# Patient Record
Sex: Male | Born: 1945 | Race: White | Hispanic: No | Marital: Married | Smoking: Former smoker
Health system: Southern US, Community
[De-identification: ages and names within clinical notes are randomized; demographics above are authoritative.]

## PROBLEM LIST (undated history)

## (undated) DIAGNOSIS — K219 Gastro-esophageal reflux disease without esophagitis: Secondary | ICD-10-CM

## (undated) DIAGNOSIS — I4891 Unspecified atrial fibrillation: Secondary | ICD-10-CM

## (undated) DIAGNOSIS — E119 Type 2 diabetes mellitus without complications: Secondary | ICD-10-CM

## (undated) DIAGNOSIS — N189 Chronic kidney disease, unspecified: Secondary | ICD-10-CM

## (undated) DIAGNOSIS — I10 Essential (primary) hypertension: Secondary | ICD-10-CM

## (undated) DIAGNOSIS — M1990 Unspecified osteoarthritis, unspecified site: Secondary | ICD-10-CM

## (undated) HISTORY — PX: EYE SURGERY: SHX253

## (undated) HISTORY — PX: KNEE ARTHROSCOPY: SUR90

## (undated) HISTORY — PX: JOINT REPLACEMENT: SHX530

## (undated) HISTORY — PX: GANGLION CYST EXCISION: SHX1691

---

## 2013-03-22 DEATH — deceased

## 2014-04-14 ENCOUNTER — Other Ambulatory Visit: Payer: Self-pay | Admitting: Neurology

## 2014-04-14 DIAGNOSIS — M542 Cervicalgia: Secondary | ICD-10-CM

## 2014-05-08 ENCOUNTER — Other Ambulatory Visit: Payer: Self-pay

## 2014-05-15 ENCOUNTER — Ambulatory Visit
Admission: RE | Admit: 2014-05-15 | Discharge: 2014-05-15 | Disposition: A | Discharge status: Home / Self Care | Payer: Medicare HMO | Source: Ambulatory Visit | Attending: Neurology | Admitting: Neurology

## 2014-05-15 ENCOUNTER — Other Ambulatory Visit: Payer: Self-pay | Admitting: Neurology

## 2014-05-15 ENCOUNTER — Ambulatory Visit
Admission: RE | Admit: 2014-05-15 | Discharge: 2014-05-15 | Disposition: A | Discharge status: Home / Self Care | Payer: Non-veteran care | Source: Ambulatory Visit | Attending: Neurology | Admitting: Neurology

## 2014-05-15 DIAGNOSIS — T1590XA Foreign body on external eye, part unspecified, unspecified eye, initial encounter: Secondary | ICD-10-CM

## 2014-05-15 DIAGNOSIS — M542 Cervicalgia: Secondary | ICD-10-CM

## 2015-10-03 NOTE — Pre-Procedure Instructions (Addendum)
Devon SangerFred Williams  10/03/2015     Your procedure is scheduled on September 22.  [facility] at 11:30 A.M.  Call this number if you have problems the morning of surgery:  [phone]   Remember:  Do not eat food or drink liquids after midnight.  Take these medicines the morning of surgery with A SIP OF WATER: tylenol, Besivance, Durezol, Loratadine, Omeprazole, Venlafaxine   STOP Lovaza(fish oil), Vitamin D, Aspirin September 15   STOP/ Do not take Aspirin, Aleve, Naproxen, Advil, Ibuprofen, Motrin, Vitamins, Herbs, or Supplements starting September 15   How to Manage Your Diabetes Before and After Surgery  Why is it important to control my blood sugar before and after surgery? . Improving blood sugar levels before and after surgery helps healing and can limit problems. . A way of improving blood sugar control is eating a healthy diet by: o  Eating less sugar and carbohydrates o  Increasing activity/exercise o  Talking with your doctor about reaching your blood sugar goals . High blood sugars (greater than 180 mg/dL) can raise your risk of infections and slow your recovery, so you will need to focus on controlling your diabetes during the weeks before surgery. . Make sure that the doctor who takes care of your diabetes knows about your planned surgery including the date and location.  How do I manage my blood sugar before surgery? . Check your blood sugar at least 4 times a day, starting 2 days before surgery, to make sure that the level is not too high or low. o Check your blood sugar the morning of your surgery when you wake up and every 2 hours until you get to the Short Stay unit. . If your blood sugar is less than 70 mg/dL, you will need to treat for low blood sugar: o Do not take insulin. o Treat a low blood sugar (less than 70 mg/dL) with  cup of clear juice (cranberry or apple), 4 glucose tablets, OR glucose gel. o Recheck blood sugar in 15  minutes after treatment (to make sure it is greater than 70 mg/dL). If your blood sugar is not greater than 70 mg/dL on recheck, call [phone] for further instructions. . Report your blood sugar to the short stay nurse when you get to Short Stay.  . If you are admitted to the hospital after surgery: o Your blood sugar will be checked by the staff and you will probably be given insulin after surgery (instead of oral diabetes medicines) to make sure you have good blood sugar levels. o The goal for blood sugar control after surgery is 80-180 mg/dL.    WHAT DO I DO ABOUT MY DIABETES MEDICATION?   Marland Kitchen. Do not take oral diabetes medicines (pills) the morning of surgery.   Do not wear jewelry, make-up or nail polish.  Do not wear lotions, powders, or perfumes, or deoderant.  Do not shave 48 hours prior to surgery.  Men may shave face and neck.  Do not bring valuables to the hospital.  [facility] is not responsible for any belongings or valuables.  Contacts, dentures or bridgework may not be worn into surgery.  Leave your suitcase in the car.  After surgery it may be brought to your room.  For patients admitted to the hospital, discharge time will be determined by your treatment team.  Patients discharged the day of surgery will not be allowed to drive home.   [place] - Preparing  for Surgery  Before surgery, you can play an important role.  Because skin is not sterile, your skin needs to be as free of germs as possible.  You can reduce the number of germs on you skin by washing with CHG (chlorahexidine gluconate) soap before surgery.  CHG is an antiseptic cleaner which kills germs and bonds with the skin to continue killing germs even after washing.  Please DO NOT use if you have an allergy to CHG or antibacterial soaps.  If your skin becomes reddened/irritated stop using the CHG and inform your nurse when you arrive at Short Stay.  Do not shave (including legs and underarms) for at  least 48 hours prior to the first CHG shower.  You may shave your face.  Please follow these instructions carefully:   1.  Shower with CHG Soap the night before surgery and the morning of Surgery.  2.  If you choose to wash your hair, wash your hair first as usual with your normal shampoo.  3.  After you shampoo, rinse your hair and body thoroughly to remove the shampoo.  4.  Use CHG as you would any other liquid soap.  You can apply CHG directly to the skin and wash gently with scrungie or a clean washcloth.  5.  Apply the CHG Soap to your body ONLY FROM THE NECK DOWN.  Do not use on open wounds or open sores.  Avoid contact with your eyes, ears, mouth and genitals (private parts).  Wash genitals (private parts) with your normal soap.  6.  Wash thoroughly, paying special attention to the area where your surgery will be performed.  7.  Thoroughly rinse your body with warm water from the neck down.  8.  DO NOT shower/wash with your normal soap after using and rinsing off the CHG Soap.  9.  Pat yourself dry with a clean towel.            10.  Wear clean pajamas.            11.  Place clean sheets on your bed the night of your first shower and do not sleep with pets.  Day of Surgery  Do not apply any lotions the morning of surgery.  Please wear clean clothes to the hospital/surgery center.

## 2015-10-04 ENCOUNTER — Encounter (HOSPITAL_COMMUNITY): Payer: Self-pay

## 2015-10-04 ENCOUNTER — Encounter (HOSPITAL_COMMUNITY)
Admission: RE | Admit: 2015-10-04 | Discharge: 2015-10-04 | Disposition: A | Discharge status: Home / Self Care | Payer: Non-veteran care | Source: Ambulatory Visit | Attending: Orthopedic Surgery | Admitting: Orthopedic Surgery

## 2015-10-04 DIAGNOSIS — Z01818 Encounter for other preprocedural examination: Secondary | ICD-10-CM | POA: Diagnosis present

## 2015-10-04 HISTORY — DX: Essential (primary) hypertension: I10

## 2015-10-04 HISTORY — DX: Chronic kidney disease, unspecified: N18.9

## 2015-10-04 HISTORY — DX: Unspecified osteoarthritis, unspecified site: M19.90

## 2015-10-04 HISTORY — DX: Gastro-esophageal reflux disease without esophagitis: K21.9

## 2015-10-04 HISTORY — DX: Type 2 diabetes mellitus without complications: E11.9

## 2015-10-04 LAB — BASIC METABOLIC PANEL
Anion gap: 8 (ref 5–15)
BUN: 16 mg/dL (ref 6–20)
CALCIUM: 9.7 mg/dL (ref 8.9–10.3)
CO2: 23 mmol/L (ref 22–32)
CREATININE: 1.25 mg/dL — AB (ref 0.61–1.24)
Chloride: 107 mmol/L (ref 101–111)
GFR calc non Af Amer: 57 mL/min — ABNORMAL LOW (ref >60)
GLUCOSE: 130 mg/dL — AB (ref 65–99)
Potassium: 4.3 mmol/L (ref 3.5–5.1)
Sodium: 138 mmol/L (ref 135–145)

## 2015-10-04 LAB — CBC
HEMATOCRIT: 43.2 % (ref 39.0–52.0)
Hemoglobin: 14.4 g/dL (ref 13.0–17.0)
MCH: 30.6 pg (ref 26.0–34.0)
MCHC: 33.3 g/dL (ref 30.0–36.0)
MCV: 91.9 fL (ref 78.0–100.0)
PLATELETS: 235 10*3/uL (ref 150–400)
RBC: 4.7 MIL/uL (ref 4.22–5.81)
RDW: 13 % (ref 11.5–15.5)
WBC: 8.2 10*3/uL (ref 4.0–10.5)

## 2015-10-04 LAB — SURGICAL PCR SCREEN
MRSA, PCR: NEGATIVE
Staphylococcus aureus: NEGATIVE

## 2015-10-04 LAB — GLUCOSE, CAPILLARY: GLUCOSE-CAPILLARY: 132 mg/dL — AB (ref 65–99)

## 2015-10-04 NOTE — H&P (Signed)
Devon Williams is an 70 y.o. male.    Chief Complaint: left knee pain  HPI: Pt is a 70 y.o. male complaining of left knee pain for multiple years. Pain had continually increased since the beginning. X-rays in the clinic show end-stage arthritic changes of the left knee. Pt has tried various conservative treatments which have failed to alleviate their symptoms, including injections and therapy. Various options are discussed with the patient. Risks, benefits and expectations were discussed with the patient. Patient understand the risks, benefits and expectations and wishes to proceed with surgery.   PCP:  WEAVER,JOHN W, MD  D/C Plans: Home  PMH: Past Medical History:  Diagnosis Date  . Arthritis   . Chronic kidney disease   . Diabetes mellitus without complication (HCC)   . GERD (gastroesophageal reflux disease)   . Hypertension     PSH: Past Surgical History:  Procedure Laterality Date  . EYE SURGERY Left    cataract-to have rt done 09/04/15  . GANGLION CYST EXCISION Right    hand  . KNEE ARTHROSCOPY Left    x3    Social History:  reports that he quit smoking about 39 years ago. His smoking use included Cigarettes. He smoked 1.00 pack per day. He quit smokeless tobacco use about 6 years ago. He reports that he does not drink alcohol or use drugs.  Allergies:  No Known Allergies  Medications: No current facility-administered medications for this encounter.    Current Outpatient Prescriptions  Medication Sig Dispense Refill  . acetaminophen (TYLENOL) 500 MG tablet Take 1,000 mg by mouth 2 (two) times daily.    . aspirin EC 81 MG tablet Take 81 mg by mouth at bedtime.    . atorvastatin (LIPITOR) 80 MG tablet Take 80 mg by mouth daily.    . BESIVANCE 0.6 % SUSP Place 1 drop into the left eye 3 (three) times daily.  1  . Cholecalciferol (VITAMIN D3) 2000 units capsule Take 2,000 Units by mouth daily.    . DUREZOL 0.05 % EMUL Place 1 drop into the left eye 2 (two) times daily. For  1 week then go to 1 drop into left eye once daily for 1 week  1  . glipiZIDE (GLUCOTROL) 10 MG tablet Take 10 mg by mouth 2 (two) times daily.    . linagliptin (TRADJENTA) 5 MG TABS tablet Take 5 mg by mouth daily.    . lisinopril (PRINIVIL,ZESTRIL) 20 MG tablet Take 10 mg by mouth at bedtime.    . loratadine (CLARITIN) 10 MG tablet Take 10 mg by mouth daily.    . lurasidone (LATUDA) 40 MG TABS tablet Take 40 mg by mouth at bedtime.    . omega-3 acid ethyl esters (LOVAZA) 1 g capsule Take 2 g by mouth 2 (two) times daily.    . omeprazole (PRILOSEC) 20 MG capsule Take 20 mg by mouth at bedtime.    . PROLENSA 0.07 % SOLN Place 1 drop into the left eye at bedtime.  1  . tiZANidine (ZANAFLEX) 4 MG tablet Take 2 mg by mouth at bedtime.    . venlafaxine XR (EFFEXOR-XR) 150 MG 24 hr capsule Take 150 mg by mouth 2 (two) times daily. Morning and noon    . oxyCODONE-acetaminophen (PERCOCET/ROXICET) 5-325 MG tablet Take 1 tablet by mouth every 4 (four) hours as needed for severe pain.      Results for orders placed or performed during the hospital encounter of 10/04/15 (from the past 48 hour(s))  Glucose,   capillary     Status: Abnormal   Collection Time: 10/04/15  8:40 AM  Result Value Ref Range   Glucose-Capillary 132 (H) 65 - 99 mg/dL  CBC     Status: None   Collection Time: 10/04/15  9:03 AM  Result Value Ref Range   WBC 8.2 4.0 - 10.5 K/uL   RBC 4.70 4.22 - 5.81 MIL/uL   Hemoglobin 14.4 13.0 - 17.0 g/dL   HCT 43.2 39.0 - 52.0 %   MCV 91.9 78.0 - 100.0 fL   MCH 30.6 26.0 - 34.0 pg   MCHC 33.3 30.0 - 36.0 g/dL   RDW 13.0 11.5 - 15.5 %   Platelets 235 150 - 400 K/uL  Basic metabolic panel     Status: Abnormal   Collection Time: 10/04/15  9:03 AM  Result Value Ref Range   Sodium 138 135 - 145 mmol/L   Potassium 4.3 3.5 - 5.1 mmol/L   Chloride 107 101 - 111 mmol/L   CO2 23 22 - 32 mmol/L   Glucose, Bld 130 (H) 65 - 99 mg/dL   BUN 16 6 - 20 mg/dL   Creatinine, Ser 1.25 (H) 0.61 - 1.24  mg/dL   Calcium 9.7 8.9 - 10.3 mg/dL   GFR calc non Af Amer 57 (L) >60 mL/min   GFR calc Af Amer >60 >60 mL/min    Comment: (NOTE) The eGFR has been calculated using the CKD EPI equation. This calculation has not been validated in all clinical situations. eGFR's persistently <60 mL/min signify possible Chronic Kidney Disease.    Anion gap 8 5 - 15  Surgical pcr screen     Status: None   Collection Time: 10/04/15  9:08 AM  Result Value Ref Range   MRSA, PCR NEGATIVE NEGATIVE   Staphylococcus aureus NEGATIVE NEGATIVE    Comment:        The Xpert SA Assay (FDA approved for NASAL specimens in patients over 21 years of age), is one component of a comprehensive surveillance program.  Test performance has been validated by [facility] for patients greater than or equal to 1 year old. It is not intended to diagnose infection nor to guide or monitor treatment.    No results found.  ROS: Pain with rom of the left lower extremity  Physical Exam:  Alert and oriented 70 y.o. male in no acute distress Cranial nerves 2-12 intact Cervical spine: full rom with no tenderness, nv intact distally Chest: active breath sounds bilaterally, no wheeze rhonchi or rales Heart: regular rate and rhythm, no murmur Abd: non tender non distended with active bowel sounds Hip is stable with rom  Left knee with moderate medial and lateral joint line tenderness nv intact distally No rashes or edema Antalgic gait  Assessment/Plan Assessment: left knee end stage osteoarthritis  Plan: Patient will undergo a left total knee arthroplasty by Dr. Norris at [facility]. Risks benefits and expectations were discussed with the patient. Patient understand risks, benefits and expectations and wishes to proceed.  

## 2015-10-04 NOTE — Progress Notes (Signed)
   10/04/15 0848  OBSTRUCTIVE SLEEP APNEA  Have you ever been diagnosed with sleep apnea through a sleep study? No  Do you snore loudly (loud enough to be heard through closed doors)?  0  Do you often feel tired, fatigued, or sleepy during the daytime (such as falling asleep during driving or talking to someone)? 0  Has anyone observed you stop breathing during your sleep? 0  Do you have, or are you being treated for high blood pressure? 1  BMI more than 35 kg/m2? 1  Age > 50 (1-yes) 1  Neck circumference greater than:Male 16 inches or larger, Male 17inches or larger? 1 (18.5)  Male Gender (Yes=1) 1  Obstructive Sleep Apnea Score 5  Score 5 or greater  Results sent to PCP

## 2015-10-05 LAB — HEMOGLOBIN A1C
Hgb A1c MFr Bld: 7.4 % — ABNORMAL HIGH (ref 4.8–5.6)
Mean Plasma Glucose: 166 mg/dL

## 2015-10-06 NOTE — Progress Notes (Signed)
Anesthesia Chart Review:  Pt is a 70 year old male scheduled for L total knee arthroplasty on 10/14/2015 with Beverely LowSteve Norris, MD.   - PCP is Devon DolinJohn Weaver, MD (care everywhere) who has cleared pt for surgery - Nephrologist is Devon ListerSameea Sadiq, MD (care everywhere)  PMH includes:  HTN, DM, CKD (stage 3), GERD. Former smoker. BMI 39  Medications include: ASA, lipitor, glipizide, linagliptin, lisinopril, latuda, prilosec  Preoperative labs reviewed.  HgbA1c 7.4, glucose 130  CXR 07/21/15: Mild bibasilar subsegmental atelectasis  EKG 07/21/15: Sinus rhythm. LAFB. Cannot rule out inferior infarct, age undetermined.   Echo 08/19/15:  1. Technically adequate study 2. LV cavity is normal in size 3. Low normal global wall motion. EF 50% 4. Structurally normal mitral valve with trace regurgitation 5. Structurally normal tricuspid valve with trace regurgitation  If no changes, I anticipate pt can proceed with surgery as scheduled.   Devon Mastngela Pasha Broad, FNP-BC [facility]/Anesthesiology Phone: [phone] 10/06/2015 3:11 PM

## 2015-10-13 MED ORDER — DEXTROSE 5 % IV SOLN
3.0000 g | INTRAVENOUS | Status: AC
  Administered 2015-10-14: 3 g via INTRAVENOUS
  Filled 2015-10-13: qty 3000

## 2015-10-14 ENCOUNTER — Encounter (HOSPITAL_COMMUNITY)
Admission: RE | Disposition: A | Discharge status: Skilled Nursing Facility | Payer: Self-pay | Source: Ambulatory Visit | Attending: Orthopedic Surgery

## 2015-10-14 ENCOUNTER — Encounter (HOSPITAL_COMMUNITY): Payer: Self-pay | Admitting: *Deleted

## 2015-10-14 ENCOUNTER — Inpatient Hospital Stay (HOSPITAL_COMMUNITY): Payer: No Typology Code available for payment source | Admitting: Emergency Medicine

## 2015-10-14 ENCOUNTER — Inpatient Hospital Stay (HOSPITAL_COMMUNITY): Payer: No Typology Code available for payment source | Admitting: Certified Registered"

## 2015-10-14 ENCOUNTER — Inpatient Hospital Stay (HOSPITAL_COMMUNITY): Payer: No Typology Code available for payment source

## 2015-10-14 ENCOUNTER — Inpatient Hospital Stay (HOSPITAL_COMMUNITY)
Admission: RE | Admit: 2015-10-14 | Discharge: 2015-10-26 | DRG: 469 | Disposition: A | Discharge status: Skilled Nursing Facility | Payer: No Typology Code available for payment source | Source: Ambulatory Visit | Attending: Orthopedic Surgery | Admitting: Orthopedic Surgery

## 2015-10-14 DIAGNOSIS — M1712 Unilateral primary osteoarthritis, left knee: Principal | ICD-10-CM | POA: Diagnosis present

## 2015-10-14 DIAGNOSIS — I10 Essential (primary) hypertension: Secondary | ICD-10-CM | POA: Diagnosis not present

## 2015-10-14 DIAGNOSIS — E1122 Type 2 diabetes mellitus with diabetic chronic kidney disease: Secondary | ICD-10-CM | POA: Diagnosis present

## 2015-10-14 DIAGNOSIS — I471 Supraventricular tachycardia: Secondary | ICD-10-CM | POA: Diagnosis not present

## 2015-10-14 DIAGNOSIS — N179 Acute kidney failure, unspecified: Secondary | ICD-10-CM | POA: Diagnosis not present

## 2015-10-14 DIAGNOSIS — N183 Chronic kidney disease, stage 3 unspecified: Secondary | ICD-10-CM | POA: Diagnosis present

## 2015-10-14 DIAGNOSIS — Z7982 Long term (current) use of aspirin: Secondary | ICD-10-CM

## 2015-10-14 DIAGNOSIS — Z79899 Other long term (current) drug therapy: Secondary | ICD-10-CM

## 2015-10-14 DIAGNOSIS — E1129 Type 2 diabetes mellitus with other diabetic kidney complication: Secondary | ICD-10-CM | POA: Diagnosis present

## 2015-10-14 DIAGNOSIS — Z9842 Cataract extraction status, left eye: Secondary | ICD-10-CM | POA: Diagnosis not present

## 2015-10-14 DIAGNOSIS — Z6838 Body mass index (BMI) 38.0-38.9, adult: Secondary | ICD-10-CM

## 2015-10-14 DIAGNOSIS — E860 Dehydration: Secondary | ICD-10-CM | POA: Diagnosis not present

## 2015-10-14 DIAGNOSIS — R509 Fever, unspecified: Secondary | ICD-10-CM

## 2015-10-14 DIAGNOSIS — Z96652 Presence of left artificial knee joint: Secondary | ICD-10-CM | POA: Diagnosis not present

## 2015-10-14 DIAGNOSIS — R2681 Unsteadiness on feet: Secondary | ICD-10-CM

## 2015-10-14 DIAGNOSIS — Z87891 Personal history of nicotine dependence: Secondary | ICD-10-CM | POA: Diagnosis not present

## 2015-10-14 DIAGNOSIS — G9340 Encephalopathy, unspecified: Secondary | ICD-10-CM | POA: Diagnosis present

## 2015-10-14 DIAGNOSIS — R29810 Facial weakness: Secondary | ICD-10-CM | POA: Diagnosis not present

## 2015-10-14 DIAGNOSIS — R29818 Other symptoms and signs involving the nervous system: Secondary | ICD-10-CM | POA: Diagnosis not present

## 2015-10-14 DIAGNOSIS — I129 Hypertensive chronic kidney disease with stage 1 through stage 4 chronic kidney disease, or unspecified chronic kidney disease: Secondary | ICD-10-CM | POA: Diagnosis not present

## 2015-10-14 DIAGNOSIS — E669 Obesity, unspecified: Secondary | ICD-10-CM | POA: Diagnosis present

## 2015-10-14 DIAGNOSIS — Z7984 Long term (current) use of oral hypoglycemic drugs: Secondary | ICD-10-CM

## 2015-10-14 DIAGNOSIS — K219 Gastro-esophageal reflux disease without esophagitis: Secondary | ICD-10-CM | POA: Diagnosis present

## 2015-10-14 DIAGNOSIS — Z96659 Presence of unspecified artificial knee joint: Secondary | ICD-10-CM

## 2015-10-14 DIAGNOSIS — R55 Syncope and collapse: Secondary | ICD-10-CM | POA: Diagnosis not present

## 2015-10-14 DIAGNOSIS — J690 Pneumonitis due to inhalation of food and vomit: Secondary | ICD-10-CM | POA: Diagnosis not present

## 2015-10-14 DIAGNOSIS — I679 Cerebrovascular disease, unspecified: Secondary | ICD-10-CM | POA: Diagnosis not present

## 2015-10-14 DIAGNOSIS — J189 Pneumonia, unspecified organism: Secondary | ICD-10-CM

## 2015-10-14 DIAGNOSIS — I951 Orthostatic hypotension: Secondary | ICD-10-CM | POA: Diagnosis not present

## 2015-10-14 DIAGNOSIS — M25562 Pain in left knee: Secondary | ICD-10-CM | POA: Diagnosis present

## 2015-10-14 DIAGNOSIS — I639 Cerebral infarction, unspecified: Secondary | ICD-10-CM

## 2015-10-14 HISTORY — PX: TOTAL KNEE ARTHROPLASTY: SHX125

## 2015-10-14 LAB — GLUCOSE, CAPILLARY
GLUCOSE-CAPILLARY: 120 mg/dL — AB (ref 65–99)
Glucose-Capillary: 154 mg/dL — ABNORMAL HIGH (ref 65–99)
Glucose-Capillary: 182 mg/dL — ABNORMAL HIGH (ref 65–99)

## 2015-10-14 LAB — CBC
HCT: 42.7 % (ref 39.0–52.0)
Hemoglobin: 14 g/dL (ref 13.0–17.0)
MCH: 30.9 pg (ref 26.0–34.0)
MCHC: 32.8 g/dL (ref 30.0–36.0)
MCV: 94.3 fL (ref 78.0–100.0)
PLATELETS: 205 10*3/uL (ref 150–400)
RBC: 4.53 MIL/uL (ref 4.22–5.81)
RDW: 13.2 % (ref 11.5–15.5)
WBC: 13.9 10*3/uL — AB (ref 4.0–10.5)

## 2015-10-14 LAB — CREATININE, SERUM
CREATININE: 1.44 mg/dL — AB (ref 0.61–1.24)
GFR calc non Af Amer: 48 mL/min — ABNORMAL LOW (ref >60)
GFR, EST AFRICAN AMERICAN: 56 mL/min — AB (ref >60)

## 2015-10-14 SURGERY — ARTHROPLASTY, KNEE, TOTAL
Anesthesia: Regional | Site: Knee | Laterality: Left

## 2015-10-14 MED ORDER — KETOROLAC TROMETHAMINE 0.5 % OP SOLN
1.0000 [drp] | Freq: Every day | OPHTHALMIC | Status: DC
  Administered 2015-10-14 – 2015-10-15 (×2): 1 [drp] via OPHTHALMIC
  Filled 2015-10-14: qty 3

## 2015-10-14 MED ORDER — ACETAMINOPHEN 500 MG PO TABS
1000.0000 mg | ORAL_TABLET | Freq: Two times a day (BID) | ORAL | Status: DC
  Administered 2015-10-14 – 2015-10-26 (×24): 1000 mg via ORAL
  Filled 2015-10-14 (×24): qty 2

## 2015-10-14 MED ORDER — WARFARIN SODIUM 7.5 MG PO TABS
7.5000 mg | ORAL_TABLET | Freq: Once | ORAL | Status: AC
  Administered 2015-10-14: 7.5 mg via ORAL
  Filled 2015-10-14: qty 1

## 2015-10-14 MED ORDER — SODIUM CHLORIDE 0.9 % IV SOLN
INTRAVENOUS | Status: DC
  Administered 2015-10-14 – 2015-10-17 (×2): via INTRAVENOUS

## 2015-10-14 MED ORDER — FENTANYL CITRATE (PF) 100 MCG/2ML IJ SOLN
INTRAMUSCULAR | Status: AC
  Filled 2015-10-14: qty 2

## 2015-10-14 MED ORDER — LISINOPRIL 10 MG PO TABS
10.0000 mg | ORAL_TABLET | Freq: Every day | ORAL | Status: DC
  Administered 2015-10-14 – 2015-10-15 (×2): 10 mg via ORAL
  Filled 2015-10-14 (×2): qty 1

## 2015-10-14 MED ORDER — COUMADIN BOOK
Freq: Once | Status: DC
  Filled 2015-10-14: qty 1

## 2015-10-14 MED ORDER — BISACODYL 10 MG RE SUPP: 10.0000 mg | Freq: Every day | RECTAL | Status: DC | PRN

## 2015-10-14 MED ORDER — PROPOFOL 10 MG/ML IV BOLUS
INTRAVENOUS | Status: DC | PRN
  Administered 2015-10-14: 200 mg via INTRAVENOUS

## 2015-10-14 MED ORDER — TRANEXAMIC ACID 1000 MG/10ML IV SOLN
2000.0000 mg | Freq: Once | INTRAVENOUS | Status: DC
  Filled 2015-10-14: qty 20

## 2015-10-14 MED ORDER — INSULIN ASPART 100 UNIT/ML ~~LOC~~ SOLN
0.0000 [IU] | Freq: Three times a day (TID) | SUBCUTANEOUS | Status: DC
  Administered 2015-10-15 – 2015-10-16 (×4): 4 [IU] via SUBCUTANEOUS
  Administered 2015-10-16: 3 [IU] via SUBCUTANEOUS
  Administered 2015-10-16 – 2015-10-18 (×5): 4 [IU] via SUBCUTANEOUS
  Administered 2015-10-19: 7 [IU] via SUBCUTANEOUS
  Administered 2015-10-19 (×2): 4 [IU] via SUBCUTANEOUS
  Administered 2015-10-20: 7 [IU] via SUBCUTANEOUS
  Administered 2015-10-20 (×2): 15 [IU] via SUBCUTANEOUS
  Administered 2015-10-21: 7 [IU] via SUBCUTANEOUS
  Administered 2015-10-21: 4 [IU] via SUBCUTANEOUS
  Administered 2015-10-21 – 2015-10-22 (×2): 7 [IU] via SUBCUTANEOUS
  Administered 2015-10-22 – 2015-10-23 (×3): 4 [IU] via SUBCUTANEOUS
  Administered 2015-10-23 – 2015-10-24 (×3): 3 [IU] via SUBCUTANEOUS
  Administered 2015-10-24 (×2): 4 [IU] via SUBCUTANEOUS
  Administered 2015-10-25: 7 [IU] via SUBCUTANEOUS
  Administered 2015-10-25 – 2015-10-26 (×3): 4 [IU] via SUBCUTANEOUS

## 2015-10-14 MED ORDER — OXYCODONE HCL 5 MG PO TABS
10.0000 mg | ORAL_TABLET | ORAL | Status: DC | PRN
  Administered 2015-10-14: 10 mg via ORAL
  Administered 2015-10-15: 15 mg via ORAL
  Filled 2015-10-14 (×2): qty 3
  Filled 2015-10-14: qty 2
  Filled 2015-10-14: qty 3

## 2015-10-14 MED ORDER — WARFARIN - PHARMACIST DOSING INPATIENT
Freq: Every day | Status: DC
  Administered 2015-10-15: 18:00:00
  Administered 2015-10-16: 7.5
  Administered 2015-10-18 – 2015-10-23 (×4)

## 2015-10-14 MED ORDER — OMEGA-3-ACID ETHYL ESTERS 1 G PO CAPS
2.0000 g | ORAL_CAPSULE | Freq: Two times a day (BID) | ORAL | Status: DC
  Administered 2015-10-14 – 2015-10-26 (×24): 2 g via ORAL
  Filled 2015-10-14 (×25): qty 2

## 2015-10-14 MED ORDER — OXYCODONE HCL 5 MG PO TABS: 5.0000 mg | ORAL_TABLET | Freq: Once | ORAL | Status: DC | PRN

## 2015-10-14 MED ORDER — LINAGLIPTIN 5 MG PO TABS
5.0000 mg | ORAL_TABLET | Freq: Every day | ORAL | Status: DC
  Administered 2015-10-14 – 2015-10-17 (×4): 5 mg via ORAL
  Filled 2015-10-14 (×4): qty 1

## 2015-10-14 MED ORDER — ENOXAPARIN SODIUM 40 MG/0.4ML ~~LOC~~ SOLN: 40.0000 mg | SUBCUTANEOUS | 0 refills | Status: DC

## 2015-10-14 MED ORDER — 0.9 % SODIUM CHLORIDE (POUR BTL) OPTIME
TOPICAL | Status: DC | PRN
  Administered 2015-10-14: 1000 mL

## 2015-10-14 MED ORDER — LURASIDONE HCL 40 MG PO TABS
40.0000 mg | ORAL_TABLET | Freq: Every day | ORAL | Status: DC
  Administered 2015-10-14 – 2015-10-25 (×12): 40 mg via ORAL
  Filled 2015-10-14 (×13): qty 1

## 2015-10-14 MED ORDER — CHLORHEXIDINE GLUCONATE 4 % EX LIQD: 60.0000 mL | Freq: Once | CUTANEOUS | Status: DC

## 2015-10-14 MED ORDER — LIDOCAINE 2% (20 MG/ML) 5 ML SYRINGE
INTRAMUSCULAR | Status: DC | PRN
  Administered 2015-10-14: 80 mg via INTRAVENOUS

## 2015-10-14 MED ORDER — FENTANYL CITRATE (PF) 100 MCG/2ML IJ SOLN
50.0000 ug | Freq: Once | INTRAMUSCULAR | Status: AC
  Administered 2015-10-14: 100 ug via INTRAVENOUS

## 2015-10-14 MED ORDER — CEFAZOLIN SODIUM-DEXTROSE 2-4 GM/100ML-% IV SOLN
2.0000 g | Freq: Four times a day (QID) | INTRAVENOUS | Status: AC
  Administered 2015-10-14 – 2015-10-15 (×2): 2 g via INTRAVENOUS
  Filled 2015-10-14 (×2): qty 100

## 2015-10-14 MED ORDER — TRANEXAMIC ACID 1000 MG/10ML IV SOLN
INTRAVENOUS | Status: DC | PRN
  Administered 2015-10-14: 2000 mg via TOPICAL

## 2015-10-14 MED ORDER — PROPOFOL 10 MG/ML IV BOLUS
INTRAVENOUS | Status: AC
  Filled 2015-10-14: qty 20

## 2015-10-14 MED ORDER — ATORVASTATIN CALCIUM 80 MG PO TABS
80.0000 mg | ORAL_TABLET | Freq: Every day | ORAL | Status: DC
  Administered 2015-10-14 – 2015-10-26 (×13): 80 mg via ORAL
  Filled 2015-10-14 (×13): qty 1

## 2015-10-14 MED ORDER — DOCUSATE SODIUM 100 MG PO CAPS
100.0000 mg | ORAL_CAPSULE | Freq: Two times a day (BID) | ORAL | Status: DC
  Administered 2015-10-14 – 2015-10-26 (×25): 100 mg via ORAL
  Filled 2015-10-14 (×24): qty 1

## 2015-10-14 MED ORDER — DIFLUPREDNATE 0.05 % OP EMUL
1.0000 [drp] | Freq: Two times a day (BID) | OPHTHALMIC | Status: DC
  Administered 2015-10-15 – 2015-10-16 (×2): 1 [drp] via OPHTHALMIC

## 2015-10-14 MED ORDER — ENOXAPARIN SODIUM 30 MG/0.3ML ~~LOC~~ SOLN
30.0000 mg | Freq: Two times a day (BID) | SUBCUTANEOUS | Status: DC
  Administered 2015-10-15 – 2015-10-20 (×11): 30 mg via SUBCUTANEOUS
  Filled 2015-10-14 (×11): qty 0.3

## 2015-10-14 MED ORDER — SODIUM CHLORIDE 0.9 % IR SOLN
Status: DC | PRN
  Administered 2015-10-14: 3000 mL

## 2015-10-14 MED ORDER — MENTHOL 3 MG MT LOZG: 1.0000 | LOZENGE | OROMUCOSAL | Status: DC | PRN

## 2015-10-14 MED ORDER — INSULIN ASPART 100 UNIT/ML ~~LOC~~ SOLN
0.0000 [IU] | Freq: Every day | SUBCUTANEOUS | Status: DC
  Administered 2015-10-18 – 2015-10-24 (×4): 2 [IU] via SUBCUTANEOUS

## 2015-10-14 MED ORDER — ASPIRIN EC 81 MG PO TBEC
81.0000 mg | DELAYED_RELEASE_TABLET | Freq: Every day | ORAL | Status: DC
  Administered 2015-10-14 – 2015-10-25 (×12): 81 mg via ORAL
  Filled 2015-10-14 (×12): qty 1

## 2015-10-14 MED ORDER — METOPROLOL TARTRATE 5 MG/5ML IV SOLN
INTRAVENOUS | Status: DC | PRN
  Administered 2015-10-14 (×2): 2.5 mg via INTRAVENOUS

## 2015-10-14 MED ORDER — METOCLOPRAMIDE HCL 5 MG PO TABS: 5.0000 mg | ORAL_TABLET | Freq: Three times a day (TID) | ORAL | Status: DC | PRN

## 2015-10-14 MED ORDER — LORATADINE 10 MG PO TABS
10.0000 mg | ORAL_TABLET | Freq: Every day | ORAL | Status: DC
  Administered 2015-10-14 – 2015-10-26 (×13): 10 mg via ORAL
  Filled 2015-10-14 (×13): qty 1

## 2015-10-14 MED ORDER — DIFLUPREDNATE 0.05 % OP EMUL: 1.0000 [drp] | Freq: Two times a day (BID) | OPHTHALMIC | Status: DC

## 2015-10-14 MED ORDER — TRANEXAMIC ACID 1000 MG/10ML IV SOLN
1000.0000 mg | INTRAVENOUS | Status: AC
  Administered 2015-10-14: 1000 mg via INTRAVENOUS
  Filled 2015-10-14: qty 10

## 2015-10-14 MED ORDER — FENTANYL CITRATE (PF) 100 MCG/2ML IJ SOLN
INTRAMUSCULAR | Status: DC | PRN
  Administered 2015-10-14 (×4): 50 ug via INTRAVENOUS
  Administered 2015-10-14: 100 ug via INTRAVENOUS

## 2015-10-14 MED ORDER — ONDANSETRON HCL 4 MG/2ML IJ SOLN: 4.0000 mg | Freq: Four times a day (QID) | INTRAMUSCULAR | Status: DC | PRN

## 2015-10-14 MED ORDER — HYDROMORPHONE HCL 1 MG/ML IJ SOLN
0.2500 mg | INTRAMUSCULAR | Status: DC | PRN
  Administered 2015-10-14: 0.5 mg via INTRAVENOUS

## 2015-10-14 MED ORDER — TIZANIDINE HCL 4 MG PO TABS
2.0000 mg | ORAL_TABLET | Freq: Every day | ORAL | Status: DC
  Administered 2015-10-14 – 2015-10-15 (×2): 2 mg via ORAL
  Filled 2015-10-14 (×3): qty 1

## 2015-10-14 MED ORDER — HYDROMORPHONE HCL 1 MG/ML IJ SOLN
INTRAMUSCULAR | Status: AC
  Filled 2015-10-14: qty 1

## 2015-10-14 MED ORDER — INSULIN ASPART 100 UNIT/ML ~~LOC~~ SOLN
6.0000 [IU] | Freq: Three times a day (TID) | SUBCUTANEOUS | Status: DC
Start: 2015-10-15 — End: 2015-10-26
  Administered 2015-10-15 – 2015-10-26 (×32): 6 [IU] via SUBCUTANEOUS

## 2015-10-14 MED ORDER — BUPIVACAINE-EPINEPHRINE (PF) 0.5% -1:200000 IJ SOLN
INTRAMUSCULAR | Status: DC | PRN
  Administered 2015-10-14: 30 mL via PERINEURAL

## 2015-10-14 MED ORDER — METOCLOPRAMIDE HCL 5 MG/ML IJ SOLN: 5.0000 mg | Freq: Three times a day (TID) | INTRAMUSCULAR | Status: DC | PRN

## 2015-10-14 MED ORDER — ONDANSETRON HCL 4 MG PO TABS: 4.0000 mg | ORAL_TABLET | Freq: Four times a day (QID) | ORAL | Status: DC | PRN

## 2015-10-14 MED ORDER — BSS IO SOLN
INTRAOCULAR | Status: AC
  Filled 2015-10-14: qty 15

## 2015-10-14 MED ORDER — METOPROLOL TARTRATE 5 MG/5ML IV SOLN
INTRAVENOUS | Status: AC
  Filled 2015-10-14: qty 5

## 2015-10-14 MED ORDER — POLYETHYLENE GLYCOL 3350 17 G PO PACK
17.0000 g | PACK | Freq: Every day | ORAL | Status: DC | PRN
  Administered 2015-10-25: 17 g via ORAL
  Filled 2015-10-14: qty 1

## 2015-10-14 MED ORDER — GATIFLOXACIN 0.5 % OP SOLN
1.0000 [drp] | Freq: Four times a day (QID) | OPHTHALMIC | Status: DC
  Administered 2015-10-14 – 2015-10-16 (×9): 1 [drp] via OPHTHALMIC
  Filled 2015-10-14: qty 2.5

## 2015-10-14 MED ORDER — LACTATED RINGERS IV SOLN
INTRAVENOUS | Status: DC
  Administered 2015-10-14 (×2): via INTRAVENOUS

## 2015-10-14 MED ORDER — PHENOL 1.4 % MT LIQD: 1.0000 | OROMUCOSAL | Status: DC | PRN

## 2015-10-14 MED ORDER — VENLAFAXINE HCL ER 150 MG PO CP24
150.0000 mg | ORAL_CAPSULE | Freq: Two times a day (BID) | ORAL | Status: DC
  Administered 2015-10-14 – 2015-10-26 (×24): 150 mg via ORAL
  Filled 2015-10-14 (×27): qty 1

## 2015-10-14 MED ORDER — HYDROMORPHONE HCL 1 MG/ML IJ SOLN
1.0000 mg | INTRAMUSCULAR | Status: DC | PRN
  Administered 2015-10-15: 1 mg via INTRAVENOUS
  Administered 2015-10-15: 2 mg via INTRAVENOUS
  Filled 2015-10-14 (×2): qty 2

## 2015-10-14 MED ORDER — ONDANSETRON HCL 4 MG/2ML IJ SOLN
INTRAMUSCULAR | Status: DC | PRN
  Administered 2015-10-14: 4 mg via INTRAVENOUS

## 2015-10-14 MED ORDER — MIDAZOLAM HCL 2 MG/2ML IJ SOLN
INTRAMUSCULAR | Status: AC
  Administered 2015-10-14: 2 mg via INTRAVENOUS
  Filled 2015-10-14: qty 2

## 2015-10-14 MED ORDER — FERROUS SULFATE 325 (65 FE) MG PO TABS
325.0000 mg | ORAL_TABLET | Freq: Three times a day (TID) | ORAL | Status: DC
  Administered 2015-10-14 – 2015-10-24 (×30): 325 mg via ORAL
  Filled 2015-10-14 (×31): qty 1

## 2015-10-14 MED ORDER — ACETAMINOPHEN 325 MG PO TABS
650.0000 mg | ORAL_TABLET | Freq: Four times a day (QID) | ORAL | Status: DC | PRN
  Administered 2015-10-25: 650 mg via ORAL
  Filled 2015-10-14: qty 2

## 2015-10-14 MED ORDER — GLIPIZIDE 5 MG PO TABS
10.0000 mg | ORAL_TABLET | Freq: Two times a day (BID) | ORAL | Status: DC
  Administered 2015-10-14 – 2015-10-17 (×6): 10 mg via ORAL
  Filled 2015-10-14 (×6): qty 2

## 2015-10-14 MED ORDER — OXYCODONE-ACETAMINOPHEN 5-325 MG PO TABS: 1.0000 | ORAL_TABLET | ORAL | 0 refills | Status: DC | PRN

## 2015-10-14 MED ORDER — MIDAZOLAM HCL 2 MG/2ML IJ SOLN
2.0000 mg | Freq: Once | INTRAMUSCULAR | Status: AC
  Administered 2015-10-14: 2 mg via INTRAVENOUS

## 2015-10-14 MED ORDER — WARFARIN SODIUM 5 MG PO TABS: 5.0000 mg | ORAL_TABLET | Freq: Every day | ORAL | 0 refills | Status: DC

## 2015-10-14 MED ORDER — FENTANYL CITRATE (PF) 100 MCG/2ML IJ SOLN
INTRAMUSCULAR | Status: AC
  Administered 2015-10-14: 100 ug via INTRAVENOUS
  Filled 2015-10-14: qty 2

## 2015-10-14 MED ORDER — METHOCARBAMOL 500 MG PO TABS: 500.0000 mg | ORAL_TABLET | Freq: Three times a day (TID) | ORAL | 1 refills | Status: DC | PRN

## 2015-10-14 MED ORDER — OXYCODONE-ACETAMINOPHEN 5-325 MG PO TABS
1.0000 | ORAL_TABLET | ORAL | Status: DC | PRN
  Administered 2015-10-16 – 2015-10-25 (×5): 1 via ORAL
  Filled 2015-10-14 (×5): qty 1

## 2015-10-14 MED ORDER — ACETAMINOPHEN 650 MG RE SUPP: 650.0000 mg | Freq: Four times a day (QID) | RECTAL | Status: DC | PRN

## 2015-10-14 MED ORDER — CARVEDILOL 6.25 MG PO TABS
6.2500 mg | ORAL_TABLET | Freq: Two times a day (BID) | ORAL | Status: DC
  Administered 2015-10-14 – 2015-10-26 (×17): 6.25 mg via ORAL
  Filled 2015-10-14 (×21): qty 1

## 2015-10-14 MED ORDER — PANTOPRAZOLE SODIUM 40 MG PO TBEC
80.0000 mg | DELAYED_RELEASE_TABLET | Freq: Every day | ORAL | Status: DC
  Administered 2015-10-14 – 2015-10-26 (×13): 80 mg via ORAL
  Filled 2015-10-14 (×13): qty 2

## 2015-10-14 MED ORDER — VITAMIN D 1000 UNITS PO TABS
2000.0000 [IU] | ORAL_TABLET | Freq: Every day | ORAL | Status: DC
  Administered 2015-10-14 – 2015-10-26 (×14): 2000 [IU] via ORAL
  Filled 2015-10-14 (×21): qty 2

## 2015-10-14 MED ORDER — OXYCODONE HCL 5 MG/5ML PO SOLN: 5.0000 mg | Freq: Once | ORAL | Status: DC | PRN

## 2015-10-14 SURGICAL SUPPLY — 61 items
BAG DECANTER FOR FLEXI CONT (MISCELLANEOUS) ×3 IMPLANT
BANDAGE ESMARK 6X9 LF (GAUZE/BANDAGES/DRESSINGS) ×1 IMPLANT
BLADE SAG 18X100X1.27 (BLADE) ×3 IMPLANT
BLADE SAW SGTL 13X75X1.27 (BLADE) ×3 IMPLANT
BLADE SAW SGTL 18X1.27X75 (BLADE) ×2 IMPLANT
BLADE SAW SGTL 18X1.27X75MM (BLADE) ×1
BNDG ELASTIC 6X10 VLCR STRL LF (GAUZE/BANDAGES/DRESSINGS) ×3 IMPLANT
BNDG ESMARK 6X9 LF (GAUZE/BANDAGES/DRESSINGS) ×3
BNDG GAUZE ELAST 4 BULKY (GAUZE/BANDAGES/DRESSINGS) ×6 IMPLANT
BOWL SMART MIX CTS (DISPOSABLE) ×3 IMPLANT
CAP KNEE TOTAL 3 SIGMA ×3 IMPLANT
CEMENT HV SMART SET (Cement) ×6 IMPLANT
CLOSURE WOUND 1/2 X4 (GAUZE/BANDAGES/DRESSINGS) ×2
COVER SURGICAL LIGHT HANDLE (MISCELLANEOUS) ×3 IMPLANT
CUFF TOURNIQUET SINGLE 34IN LL (TOURNIQUET CUFF) ×3 IMPLANT
CUFF TOURNIQUET SINGLE 44IN (TOURNIQUET CUFF) IMPLANT
DRAPE EXTREMITY T 121X128X90 (DRAPE) ×3 IMPLANT
DRAPE IMP U-DRAPE 54X76 (DRAPES) ×3 IMPLANT
DRAPE PROXIMA HALF (DRAPES) ×3 IMPLANT
DRAPE U-SHAPE 47X51 STRL (DRAPES) ×3 IMPLANT
DRSG ADAPTIC 3X8 NADH LF (GAUZE/BANDAGES/DRESSINGS) ×3 IMPLANT
DRSG PAD ABDOMINAL 8X10 ST (GAUZE/BANDAGES/DRESSINGS) ×3 IMPLANT
DURAPREP 26ML APPLICATOR (WOUND CARE) ×3 IMPLANT
ELECT CAUTERY BLADE 6.4 (BLADE) ×3 IMPLANT
ELECT REM PT RETURN 9FT ADLT (ELECTROSURGICAL) ×3
ELECTRODE REM PT RTRN 9FT ADLT (ELECTROSURGICAL) ×1 IMPLANT
GAUZE SPONGE 4X4 12PLY STRL (GAUZE/BANDAGES/DRESSINGS) ×3 IMPLANT
GLOVE BIOGEL PI ORTHO PRO 7.5 (GLOVE) ×2
GLOVE BIOGEL PI ORTHO PRO SZ8 (GLOVE) ×2
GLOVE ORTHO TXT STRL SZ7.5 (GLOVE) ×3 IMPLANT
GLOVE PI ORTHO PRO STRL 7.5 (GLOVE) ×1 IMPLANT
GLOVE PI ORTHO PRO STRL SZ8 (GLOVE) ×1 IMPLANT
GLOVE SURG ORTHO 8.5 STRL (GLOVE) ×3 IMPLANT
GOWN STRL REUS W/ TWL XL LVL3 (GOWN DISPOSABLE) ×3 IMPLANT
GOWN STRL REUS W/TWL XL LVL3 (GOWN DISPOSABLE) ×6
HANDPIECE INTERPULSE COAX TIP (DISPOSABLE) ×2
IMMOBILIZER KNEE 22 UNIV (SOFTGOODS) ×3 IMPLANT
KIT BASIN OR (CUSTOM PROCEDURE TRAY) ×3 IMPLANT
KIT MANIFOLD (MISCELLANEOUS) IMPLANT
KIT ROOM TURNOVER OR (KITS) ×3 IMPLANT
MANIFOLD NEPTUNE II (INSTRUMENTS) ×3 IMPLANT
NS IRRIG 1000ML POUR BTL (IV SOLUTION) ×3 IMPLANT
PACK TOTAL JOINT (CUSTOM PROCEDURE TRAY) ×3 IMPLANT
PACK UNIVERSAL I (CUSTOM PROCEDURE TRAY) IMPLANT
PAD ARMBOARD 7.5X6 YLW CONV (MISCELLANEOUS) ×6 IMPLANT
SET HNDPC FAN SPRY TIP SCT (DISPOSABLE) ×1 IMPLANT
STRIP CLOSURE SKIN 1/2X4 (GAUZE/BANDAGES/DRESSINGS) ×4 IMPLANT
SUCTION FRAZIER HANDLE 10FR (MISCELLANEOUS) ×2
SUCTION TUBE FRAZIER 10FR DISP (MISCELLANEOUS) ×1 IMPLANT
SUT MNCRL AB 3-0 PS2 18 (SUTURE) ×3 IMPLANT
SUT VIC AB 0 CT1 27 (SUTURE) ×4
SUT VIC AB 0 CT1 27XBRD ANBCTR (SUTURE) ×2 IMPLANT
SUT VIC AB 1 CT1 27 (SUTURE) ×6
SUT VIC AB 1 CT1 27XBRD ANBCTR (SUTURE) ×3 IMPLANT
SUT VIC AB 2-0 CT1 27 (SUTURE) ×4
SUT VIC AB 2-0 CT1 TAPERPNT 27 (SUTURE) ×2 IMPLANT
TOWEL OR 17X24 6PK STRL BLUE (TOWEL DISPOSABLE) ×3 IMPLANT
TOWEL OR 17X26 10 PK STRL BLUE (TOWEL DISPOSABLE) ×3 IMPLANT
TRAY FOLEY CATH 16FRSI W/METER (SET/KITS/TRAYS/PACK) ×3 IMPLANT
UPCHARGE REV TRAY MBT KNEE ×3 IMPLANT
WATER STERILE IRR 1000ML POUR (IV SOLUTION) IMPLANT

## 2015-10-14 NOTE — Anesthesia Procedure Notes (Signed)
Procedure Name: LMA Insertion Date/Time: 10/14/2015 12:41 PM Performed by: Arlice ColtMANESS, Elaisha Zahniser B Pre-anesthesia Checklist: Patient identified, Emergency Drugs available, Suction available, Patient being monitored and Timeout performed Patient Re-evaluated:Patient Re-evaluated prior to inductionOxygen Delivery Method: Circle system utilized Preoxygenation: Pre-oxygenation with 100% oxygen Intubation Type: IV induction LMA: LMA inserted LMA Size: 5.0 Number of attempts: 1 Placement Confirmation: positive ETCO2 and breath sounds checked- equal and bilateral Tube secured with: Tape Dental Injury: Teeth and Oropharynx as per pre-operative assessment

## 2015-10-14 NOTE — Anesthesia Procedure Notes (Signed)
Anesthesia Regional Block:  Adductor canal block  Pre-Anesthetic Checklist: ,, timeout performed, Correct Patient, Correct Site, Correct Laterality, Correct Procedure, Correct Position, site marked, Risks and benefits discussed,  Surgical consent,  Pre-op evaluation,  At surgeon's request and post-op pain management  Laterality: Left  Prep: chloraprep       Needles:  Injection technique: Single-shot  Needle Type: Echogenic Needle     Needle Length: 9cm 9 cm Needle Gauge: 21 and 21 G    Additional Needles:  Procedures: ultrasound guided (picture in chart) Adductor canal block Narrative:  Start time: 10/14/2015 12:06 PM End time: 10/14/2015 12:16 PM Injection made incrementally with aspirations every 5 mL.  Performed by: Personally  Anesthesiologist: Onie Kasparek  Additional Notes: Pt tolerated the procedure well.

## 2015-10-14 NOTE — Anesthesia Preprocedure Evaluation (Signed)
Anesthesia Evaluation  Patient identified by MRN, date of birth, ID band Patient awake    Reviewed: Allergy & Precautions, NPO status , Patient's Chart, lab work & pertinent test results  Airway Mallampati: II   Neck ROM: full    Dental   Pulmonary former smoker,    breath sounds clear to auscultation       Cardiovascular hypertension,  Rhythm:regular Rate:Normal     Neuro/Psych    GI/Hepatic GERD  ,  Endo/Other  diabetes, Type 2  Renal/GU      Musculoskeletal  (+) Arthritis ,   Abdominal   Peds  Hematology   Anesthesia Other Findings   Reproductive/Obstetrics                             Anesthesia Physical Anesthesia Plan  ASA: II  Anesthesia Plan: General and Regional   Post-op Pain Management:  Regional for Post-op pain   Induction: Intravenous  Airway Management Planned: LMA  Additional Equipment:   Intra-op Plan:   Post-operative Plan:   Informed Consent: I have reviewed the patients History and Physical, chart, labs and discussed the procedure including the risks, benefits and alternatives for the proposed anesthesia with the patient or authorized representative who has indicated his/her understanding and acceptance.     Plan Discussed with: CRNA, Anesthesiologist and Surgeon  Anesthesia Plan Comments:         Anesthesia Quick Evaluation

## 2015-10-14 NOTE — Anesthesia Postprocedure Evaluation (Signed)
Anesthesia Post Note  Patient: Fatima SangerFred Grimsley  Procedure(s) Performed: Procedure(s) (LRB): LEFT TOTAL KNEE ARTHROPLASTY (Left)  Patient location during evaluation: PACU Anesthesia Type: General and Regional Level of consciousness: awake and alert Pain management: pain level controlled Vital Signs Assessment: post-procedure vital signs reviewed and stable Respiratory status: spontaneous breathing, nonlabored ventilation, respiratory function stable and patient connected to nasal cannula oxygen Cardiovascular status: blood pressure returned to baseline and stable Postop Assessment: no signs of nausea or vomiting Anesthetic complications: no    Last Vitals:  Vitals:   10/14/15 1220 10/14/15 1503  BP: 119/68 (!) 105/59  Pulse: 81 80  Resp: 13 18  Temp:  36.6 C    Last Pain:  Vitals:   10/14/15 1147  TempSrc: Oral                 Lovely Kerins,JAMES TERRILL

## 2015-10-14 NOTE — Progress Notes (Signed)
ANTICOAGULATION CONSULT NOTE - Initial Consult  Pharmacy Consult:  Coumadin Indication:  VTE prophylaxis  Allergies  Allergen Reactions  . Nabumetone Other (See Comments)    UNSPECIFIED REACTION     Patient Measurements: Weight: 277 lb (125.6 kg)  Vital Signs: Temp: 98.5 F (36.9 C) (09/22 1730) Temp Source: Oral (09/22 1147) BP: 125/70 (09/22 1730) Pulse Rate: 71 (09/22 1730)  Labs: No results for input(s): HGB, HCT, PLT, APTT, LABPROT, INR, HEPARINUNFRC, HEPRLOWMOCWT, CREATININE, CKTOTAL, CKMB, TROPONINI in the last 72 hours.  Estimated Creatinine Clearance: 75.3 mL/min (by C-G formula based on SCr of 1.25 mg/dL (H)).   Medical History: Past Medical History:  Diagnosis Date  . Arthritis   . Chronic kidney disease   . Diabetes mellitus without complication (HCC)   . GERD (gastroesophageal reflux disease)   . Hypertension        Assessment: Devon Williams s/p left TKA to start Coumadin.  Patient is also on prophylactic Lovenox.  No baseline INR but expect it to be normal given that patient is not on blood thinners PTA per medication history.  No overt bleeding documented post-op.   Goal of Therapy:  INR 2-3    Plan:  - Coumadin 7.5mg  PO today - Lovenox until INR >/= 1.8 - Daily PT / INR   Katilyn Miltenberger D. Laney Potashang, PharmD, BCPS Pager:  [phone] 10/14/2015, 5:56 PM

## 2015-10-14 NOTE — Consult Note (Signed)
Cardiology Consult    Patient ID: Fatima SangerFred Emley MRN: [id number], DOB/AGE: 69/01/1945   Admit date: 10/14/2015 Date of Consult: 10/14/2015  Primary Physician: Elijio MilesWEAVER,JOHN W, MD Reason for Consult: SVT Primary Cardiologist:  New Requesting Provider: Dr. Ranell PatrickNorris  Patient Profile  Mr. Katrinka BlazingSmith is a 70 year old male with a past medical history of chronic renal insufficiency, DM, and HTN.   History of Present Illness  Mr. Katrinka BlazingSmith had a left total knee arthroplasty today. He developed some tachycardia intraoperatively. Also had some atrial tachycardia post procedure.   He denies history of CAD, denies a family history of CAD. He denies ever having any chest pain or SOB. He is not a smoker. He does have diabetes. He tells me that he never feels any heart palpitations.   Telemetry reviewed, has episodes of atrial tachycardia with rates in 150's.   Past Medical History   Past Medical History:  Diagnosis Date  . Arthritis   . Chronic kidney disease   . Diabetes mellitus without complication (HCC)   . GERD (gastroesophageal reflux disease)   . Hypertension     Past Surgical History:  Procedure Laterality Date  . EYE SURGERY Left    cataract-to have rt done 09/04/15  . GANGLION CYST EXCISION Right    hand  . KNEE ARTHROSCOPY Left    x3     Allergies  Allergies  Allergen Reactions  . Nabumetone Other (See Comments)    UNSPECIFIED REACTION     Inpatient Medications    . chlorhexidine  60 mL Topical Once  . HYDROmorphone      . tranexamic acid (CYKLOKAPRON) topical -INTRAOP  2,000 mg Topical Once    Family History    History reviewed. No pertinent family history.  Social History    Social History   Social History  . Marital status: Married    Spouse name: N/A  . Number of children: N/A  . Years of education: N/A   Occupational History  . Not on file.   Social History Main Topics  . Smoking status: Former Smoker    Packs/day: 1.00    Types: Cigarettes    Quit  date: 10/03/1976  . Smokeless tobacco: Former NeurosurgeonUser    Quit date: 10/03/2009  . Alcohol use No  . Drug use: No  . Sexual activity: Not on file   Other Topics Concern  . Not on file   Social History Narrative  . No narrative on file     Review of Systems    General:  No chills, fever, night sweats or weight changes.  Cardiovascular:  No chest pain, dyspnea on exertion, edema, orthopnea, palpitations, paroxysmal nocturnal dyspnea. Dermatological: No rash, lesions/masses Respiratory: No cough, dyspnea Urologic: No hematuria, dysuria Abdominal:   No nausea, vomiting, diarrhea, bright red blood per rectum, melena, or hematemesis Neurologic:  No visual changes, wkns, changes in mental status. All other systems reviewed and are otherwise negative except as noted above.  Physical Exam    Blood pressure 121/68, pulse 79, temperature 97.9 F (36.6 C), resp. rate 11, weight 277 lb (125.6 kg), SpO2 94 %.  General: Pleasant, NAD Psych: Normal affect. Neuro: Alert and oriented X 3. Moves all extremities spontaneously. HEENT: Normal  Neck: Supple without bruits or JVD. Lungs:  Resp regular and unlabored, CTA. Heart: RRR no s3, s4, or murmurs. Abdomen: Soft, non-tender, non-distended, BS + x 4.  Extremities: No clubbing, cyanosis or edema. DP/PT/Radials 2+ and equal bilaterally.  Labs  Lab Results  Component Value Date   WBC 8.2 10/04/2015   HGB 14.4 10/04/2015   HCT 43.2 10/04/2015   MCV 91.9 10/04/2015   PLT 235 10/04/2015     Radiology Studies    Dg Knee Left Port  Result Date: 10/14/2015 CLINICAL DATA:  Left total knee replacement. EXAM: PORTABLE LEFT KNEE - 1-2 VIEW COMPARISON:  None. FINDINGS: Two-view show total knee arthroplasty with components well positioned. No radiographically detectable complication. Air in the joint and soft tissues as expected. IMPRESSION: Good appearance following total knee arthroplasty. Electronically Signed   By: Paulina Fusi M.D.   On:  10/14/2015 15:40    EKG & Cardiac Imaging    EKG: NSR, nonspecific ST changes in anterolateral leads.   Assessment & Plan    1. Atrial tachycardia: Asymptomatic. Would add 6.25mg  BID of Coreg. This will help with rate control. He had an echo for preoperative clearance, which is not in the system, but according to anesthesia, echo was normal.   He will be on telemetry overnight, and we will follow up in the am.   Signed, Little Ishikawa, NP 10/14/2015, 4:41 PM Pager: [phone]  I have seen and examined the patient along with Little Ishikawa, NP.  I have reviewed the chart, notes and new data.  I agree with NP's note.  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 Can reduce lisinopril dose if BP is low. Outpatient Cardiology follow up.  Thurmon Fair, MD, [facility] [phone] 10/14/2015, 5:15 PM

## 2015-10-14 NOTE — Brief Op Note (Signed)
10/14/2015  3:00 PM  PATIENT:  Devon SangerFred Williams  70 y.o. male  PRE-OPERATIVE DIAGNOSIS:  left knee end stage osteoarthritis  POST-OPERATIVE DIAGNOSIS:  left knee end stage osteoarthritis  PROCEDURE:  Procedure(s): LEFT TOTAL KNEE ARTHROPLASTY (Left) DePuy Sigma RP with MBT revision tray  SURGEON:  Surgeon(s) and Role:    * Beverely LowSteve Nori Poland, MD - Primary  PHYSICIAN ASSISTANT:   ASSISTANTS: Thea Gisthomas B Dixon, PA-C   ANESTHESIA:   regional and general  EBL:  Total I/O In: 1000 [I.V.:1000] Out: 240 [Urine:190; Blood:50]  BLOOD ADMINISTERED:none  DRAINS: none   LOCAL MEDICATIONS USED:  NONE  SPECIMEN:  No Specimen  DISPOSITION OF SPECIMEN:  N/A  COUNTS:  YES  TOURNIQUET:   Total Tourniquet Time Documented: Thigh (Left) - 114 minutes Total: Thigh (Left) - 114 minutes   DICTATION: .Other Dictation: Dictation Number [id number]  PLAN OF CARE: Admit to inpatient   PATIENT DISPOSITION:  PACU - hemodynamically stable.   Delay start of Pharmacological VTE agent (>24hrs) due to surgical blood loss or risk of bleeding: no

## 2015-10-14 NOTE — Transfer of Care (Signed)
Immediate Anesthesia Transfer of Care Note  Patient: Fatima SangerFred Golightly  Procedure(s) Performed: Procedure(s): LEFT TOTAL KNEE ARTHROPLASTY (Left)  Patient Location: PACU  Anesthesia Type:GA combined with regional for post-op pain  Level of Consciousness: awake and responds to stimulation  Airway & Oxygen Therapy: Patient Spontanous Breathing and Patient connected to nasal cannula oxygen  Post-op Assessment: Report given to RN and Post -op Vital signs reviewed and stable  Post vital signs: Reviewed and stable  Last Vitals:  Vitals:   10/14/15 1215 10/14/15 1220  BP: 118/73 119/68  Pulse: 81 81  Resp: 11 13  Temp:      Last Pain:  Vitals:   10/14/15 1147  TempSrc: Oral         Complications: No apparent anesthesia complications

## 2015-10-14 NOTE — Progress Notes (Signed)
Orthopedic Tech Progress Note Patient Details:  Fatima SangerFred Pandey 04/26/1945 [id number] Patient exceeds weight limit for overhead frame. CPM Left Knee CPM Left Knee: On Left Knee Flexion (Degrees): 90 Left Knee Extension (Degrees): 0  Ortho Devices Ortho Device/Splint Location: footsie roll Ortho Device/Splint Interventions: Ordered, Application, Adjustment   [name], Shahzaib Azevedo Craig 10/14/2015, 3:51 PM

## 2015-10-14 NOTE — Addendum Note (Signed)
Addendum  created 10/14/15 1649 by Kipp Broodavid Yafet Cline, MD   Sign clinical note

## 2015-10-14 NOTE — Progress Notes (Signed)
At 1900 during shift change patient accidentally squeezed his left middle finger in the cpm.  He is able to move finger and there are no skin tears. Patient's wife said he was in his sleep and was pulling at things and accidentally placed his finger on the side of the cpm.  He denies any pain; ice pack in place.

## 2015-10-14 NOTE — Interval H&P Note (Signed)
History and Physical Interval Note:  10/14/2015 12:18 PM  Devon Williams  has presented today for surgery, with the diagnosis of left knee end stage osteoarthritis  The various methods of treatment have been discussed with the patient and family. After consideration of risks, benefits and other options for treatment, the patient has consented to  Procedure(s): LEFT TOTAL KNEE ARTHROPLASTY (Left) as a surgical intervention .  The patient's history has been reviewed, patient examined, no change in status, stable for surgery.  I have reviewed the patient's chart and labs.  Questions were answered to the patient's satisfaction.     [name],STEVEN R

## 2015-10-14 NOTE — Progress Notes (Signed)
Anesthesiology Note:  Devon Williams is a 70 year old male with a history of hypertension, type 2 diabetes, CKD (stage III), GERD, previous smoking, and obesity. He underwent a left total knee arthroplasty today under general anesthesia with LMA. During the surgery, under general anesthesia  he was noted to have several runs of narrow complex tachycardia with the heart rate of around 150. He was given 5 mg of IV metoprolol. Surgery proceeded uneventfully and he was extubated in the operating room and brought to the recovery room. In the recovery room, he had several more episodes of narrow complex tachycardia with heart rate of 145-150. These episodes lasted approximately 10-15 seconds. He is now on the recovery room and denies chest pain, shortness of breath,or  dizziness.  VS: T- 36.6 BP-122/54 RR-17 HR- 82 (NSR) O2 sat 95% on RA  Heart RRR No murmers Lungs- clear anteriorly  ECG: sinus rhythm L. 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 Now appears stable  Plan:  1. Transfer to 5N with telemetry monitoring 2. Consult [facility] for follow-up

## 2015-10-15 LAB — BASIC METABOLIC PANEL
Anion gap: 9 (ref 5–15)
BUN: 19 mg/dL (ref 6–20)
CALCIUM: 9 mg/dL (ref 8.9–10.3)
CO2: 24 mmol/L (ref 22–32)
CREATININE: 1.53 mg/dL — AB (ref 0.61–1.24)
Chloride: 101 mmol/L (ref 101–111)
GFR calc Af Amer: 52 mL/min — ABNORMAL LOW (ref >60)
GFR, EST NON AFRICAN AMERICAN: 45 mL/min — AB (ref >60)
GLUCOSE: 174 mg/dL — AB (ref 65–99)
Potassium: 4.5 mmol/L (ref 3.5–5.1)
Sodium: 134 mmol/L — ABNORMAL LOW (ref 135–145)

## 2015-10-15 LAB — PROTIME-INR
INR: 1.05
PROTHROMBIN TIME: 13.7 s (ref 11.4–15.2)

## 2015-10-15 LAB — CBC
HEMATOCRIT: 43 % (ref 39.0–52.0)
Hemoglobin: 13.9 g/dL (ref 13.0–17.0)
MCH: 30.8 pg (ref 26.0–34.0)
MCHC: 32.3 g/dL (ref 30.0–36.0)
MCV: 95.1 fL (ref 78.0–100.0)
PLATELETS: 219 10*3/uL (ref 150–400)
RBC: 4.52 MIL/uL (ref 4.22–5.81)
RDW: 13.3 % (ref 11.5–15.5)
WBC: 13.6 10*3/uL — AB (ref 4.0–10.5)

## 2015-10-15 LAB — GLUCOSE, CAPILLARY
GLUCOSE-CAPILLARY: 191 mg/dL — AB (ref 65–99)
Glucose-Capillary: 170 mg/dL — ABNORMAL HIGH (ref 65–99)
Glucose-Capillary: 153 mg/dL — ABNORMAL HIGH (ref 65–99)
Glucose-Capillary: 174 mg/dL — ABNORMAL HIGH (ref 65–99)

## 2015-10-15 MED ORDER — WARFARIN SODIUM 7.5 MG PO TABS
7.5000 mg | ORAL_TABLET | Freq: Once | ORAL | Status: AC
  Administered 2015-10-15: 7.5 mg via ORAL

## 2015-10-15 NOTE — Progress Notes (Signed)
OT Cancellation Note  Patient Details Name: Devon SangerFred Williams MRN: [id number] DOB: 08/03/1945   Cancelled Treatment:    Reason Eval/Treat Not Completed: Patient not medically ready;Other (comment) (Per PT) Per PT Pt not able to participate adequately in OT, will monitor to see how Pt progresses, and provide therapy when Pt is ready.  Evern BioLaura J Debraann Livingstone 10/15/2015, 10:56 AM  Sherryl MangesLaura Mamie Hundertmark OTR/L [phone]

## 2015-10-15 NOTE — Progress Notes (Signed)
Orthopedic Tech Progress Note Patient Details:  Devon SangerFred Gatley 02/23/1945 [id number]  CPM Left Knee CPM Left Knee: On Left Knee Flexion (Degrees): 50 Left Knee Extension (Degrees): 0 Additional Comments: increased from 39 to 50 degrees per patient tolerance   Saul FordyceJennifer C Adeyemi Hamad 10/15/2015, 2:38 PM

## 2015-10-15 NOTE — Progress Notes (Signed)
ANTICOAGULATION CONSULT NOTE - Initial Consult  Pharmacy Consult:  Coumadin Indication:  VTE prophylaxis  Patient Measurements: Weight: 277 lb (125.6 kg)  Labs:  Recent Labs  10/14/15 1825 10/15/15 0348  HGB 14.0 13.9  HCT 42.7 43.0  PLT 205 219  LABPROT  --  13.7  INR  --  1.05  CREATININE 1.44* 1.53*    Estimated Creatinine Clearance: 61.5 mL/min (by C-G formula based on SCr of 1.53 mg/dL (H)).   Medical History: Past Medical History:  Diagnosis Date  . Arthritis   . Chronic kidney disease   . Diabetes mellitus without complication (HCC)   . GERD (gastroesophageal reflux disease)   . Hypertension     Assessment: [age] YOM s/p left TKA on coumadin/lovenox for VTE px s/p left TKA on 9/22, no anticoagulation PTA. INR this am is 1.05 on 7.5mg  last night. CBC stable, no s/sx of bleeding noted. Score per warfarin card is 5.  Goal of Therapy:  INR 2-3    Plan:  - Coumadin 7.5mg  PO today - Lovenox until INR >/= 1.8 - Daily PT / INR  Alfredo BachJoseph Arminger, BS, PharmD Clinical Pharmacy Resident [phone] (Pager) 10/15/2015 9:55 AM

## 2015-10-15 NOTE — Evaluation (Signed)
Physical Therapy Evaluation Patient Details Name: Devon Williams MRN: [id number] DOB: [birth year] Today's Date: 10/15/2015   History of Present Illness  Patient admitted for elective TKR on left.  PMH significant for DM, chronic kidney disease  Clinical Impression  Patient presents with dependencies in gait and mobility.  Patient somewhat lethargic today and difficulty following commands, possible due to medications.  However, patient required increased assistance for all mobility.  Unless patient improves dramatically over next 1-2 days, he will need short-term SNF at discharge.  Patient will benefit from continued PT to progress mobility, ROM and strength for hopeful return to home.      Follow Up Recommendations SNF    Equipment Recommendations  Rolling walker with 5" wheels    Recommendations for Other Services       Precautions / Restrictions Precautions Precautions: Knee;Fall Required Braces or Orthoses: Knee Immobilizer - Left Knee Immobilizer - Left: Other (comment) (in bed except in CPM) Restrictions Weight Bearing Restrictions: Yes LLE Weight Bearing: Weight bearing as tolerated      Mobility  Bed Mobility Overal bed mobility: Needs Assistance Bed Mobility: Rolling;Sidelying to Sit Rolling: Mod assist;+2 for physical assistance Sidelying to sit: Mod assist;+2 for physical assistance       General bed mobility comments: required max cueing for sequencing and technique  Transfers Overall transfer level: Needs assistance Equipment used: Rolling walker (2 wheeled) Transfers: Sit to/from Stand Sit to Stand: Min assist;+2 physical assistance         General transfer comment: max cueing for sequencing and technique  Ambulation/Gait Ambulation/Gait assistance: Min assist;+2 physical assistance Ambulation Distance (Feet): 2 Feet Assistive device: Rolling walker (2 wheeled) Gait Pattern/deviations: Step-to pattern;Decreased stance time - left;Decreased stride  length;Antalgic Gait velocity: decreased   General Gait Details: limited ambulation due to lethargy and difficulty following commands.  Patient complained of dizziness during gait and thus returned to recliner.  Stairs            Wheelchair Mobility    Modified Rankin (Stroke Patients Only)       Balance Overall balance assessment: Needs assistance Sitting-balance support: No upper extremity supported;Feet supported Sitting balance-Leahy Scale: Fair     Standing balance support: Bilateral upper extremity supported;During functional activity Standing balance-Leahy Scale: Poor Standing balance comment: reliant on RW for balance/support                             Pertinent Vitals/Pain Pain Assessment: No/denies pain (was premedicated before therapy)    Home Living Family/patient expects to be discharged to:: Private residence Living Arrangements: Spouse/significant other Available Help at Discharge: Family;Available 24 hours/day Type of Home: House Home Access: Stairs to enter Entrance Stairs-Rails: Right Entrance Stairs-Number of Steps: 2 Home Layout: Two level;Able to live on main level with bedroom/bathroom Home Equipment: Gilmer Mor - single point      Prior Function Level of Independence: Independent         Comments: occasionally used cane     Hand Dominance        Extremity/Trunk Assessment   Upper Extremity Assessment: Defer to OT evaluation           Lower Extremity Assessment: LLE deficits/detail;RLE deficits/detail RLE Deficits / Details: unable to fully access - patient with difficulty following commands.  Through functional movement at least 3/5 LLE Deficits / Details: unable to fully access - patient with difficulty following commands.  Cervical / Trunk Assessment: Normal  Communication  Communication: HOH  Cognition Arousal/Alertness: Lethargic;Suspect due to medications Behavior During Therapy: Flat affect Overall  Cognitive Status: Impaired/Different from baseline Area of Impairment: Following commands       Following Commands: Follows one step commands with increased time            General Comments      Exercises     Assessment/Plan    PT Assessment Patient needs continued PT services  PT Problem List Decreased strength;Decreased range of motion;Decreased activity tolerance;Decreased balance;Decreased mobility;Decreased cognition;Decreased knowledge of use of DME;Decreased knowledge of precautions          PT Treatment Interventions DME instruction;Gait training;Stair training;Functional mobility training;Therapeutic activities;Therapeutic exercise;Balance training;Patient/family education    PT Goals (Current goals can be found in the Care Plan section)  Acute Rehab PT Goals Patient Stated Goal: wife reports to take patient home PT Goal Formulation: With patient/family Time For Goal Achievement: 10/22/15 Potential to Achieve Goals: Good    Frequency 7X/week   Barriers to discharge        Co-evaluation               End of Session Equipment Utilized During Treatment: Gait belt;Oxygen;Left knee immobilizer Activity Tolerance: Patient limited by lethargy Patient left: in chair;with call bell/phone within reach;with family/visitor present           Time: 1000-1026 PT Time Calculation (min) (ACUTE ONLY): 26 min   Charges:   PT Evaluation $PT Eval Moderate Complexity: 1 Procedure PT Treatments $Gait Training: 8-22 mins   PT G Codes:        Devon Williams, Devon Williams 10/15/2015, 10:40 AM  10/15/2015 Corlis HoveMargie Chaun Williams, PT [phone]

## 2015-10-15 NOTE — Care Management Note (Signed)
Case Management Note  Patient Details  Name: Fatima SangerFred Okray MRN: [id number] Date of Birth: 05/30/1945  Subjective/Objective:      70 yr old gentleman s/p left total knee arthroplasty              Action/Plan: [facility] aware. No Case manager needs.   Expected Discharge Date:    10/18/15            Expected Discharge Plan:   SNF  In-House Referral:     Discharge planning Services     Post Acute Care Choice:    Choice offered to:     DME Arranged:   NA DME Agency:     HH Arranged:   NA HH Agency:     Status of Service:   completed  If discussed at Long Length of Stay Meetings, dates discussed:    Additional Comments:  Durenda GuthrieBrady, Rudell Marlowe Naomi, RN 10/15/2015, 12:38 PM

## 2015-10-15 NOTE — Op Note (Signed)
Devon Williams, Devon Williams                  ACCOUNT NO.:  [id number]  MEDICAL RECORD NO.:  [id number]  LOCATION:  5N28C                        FACILITY:  [facility]  PHYSICIAN:  Almedia Balls. Ranell Patrick, M.D. DATE OF BIRTH:  [birth year]  DATE OF PROCEDURE:  10/14/2015 DATE OF DISCHARGE:                              OPERATIVE REPORT   PREOPERATIVE DIAGNOSIS:  Left knee end-stage arthritis.  POSTOPERATIVE DIAGNOSIS:  Left knee end-stage arthritis.  PROCEDURE PERFORMED:  Left total knee replacement using DePuy Sigma rotating platform prosthesis with MBT revision tray.  ATTENDING SURGEON:  Almedia Balls. Ranell Patrick, M.D.  ASSISTANT:  Donnie Coffin. Dixon, PA-C, who was scrubbed the entire procedure and necessary for satisfactory completion of surgery.  ANESTHESIA:  General anesthesia plus femoral block was used.  ESTIMATED BLOOD LOSS:  Minimal.  FLUID REPLACEMENT:  1200 mL crystalloid.  INSTRUMENT COUNTS:  Correct.  COMPLICATIONS:  There were no complications.  ANTIBIOTICS:  Perioperative antibiotics were given.  INDICATIONS:  The patient is a 70 year old male with worsening left knee pain secondary to severe end-stage osteoarthritis.  The patient has had progressive pain despite conservative management.  The patient presents now for total knee arthroplasty to restore function and eliminate pain to his knee.  Informed consent obtained.  DESCRIPTION OF PROCEDURE:  After an adequate level of anesthesia achieved, the patient was positioned in the supine position.  Left leg correctly identified, nonsterile tourniquet placed to proximal thigh. Left leg was sterilely prepped and draped in usual manner.  Time-out called.  The limb was elevated and exsanguinated using an Esmarch bandage and then a tourniquet elevated to 300 mmHg.  Longitudinal midline incision was created with the knee in flexion using a 10-blade scalpel.  Dissection down through the subcutaneous tissues using Bovie. We identified the medial  parapatellar tissues and performed a medial parapatellar arthrotomy.  We divided the lateral patellofemoral ligaments, everted the patella.  We then entered the distal femur using a step-cut drill.  We then placed our intramedullary guide for resecting 10 mm of distal femur with 5-degree valgus alignment.  Once that cut was made, we have sized the femur using the sizing jig, anterior down the size 5.  We then placed our 4-in-1 cutting block on the knee and performed our anterior, posterior and chamfer cuts.  We resected ACL, PCL and remaining meniscal tissue.  We then cut our tibia 90 degrees perpendicular to the long axis of tibia with the external alignment jig, with 2 degrees posterior slope for the MBT revision tray.  Once we had our tibial cut made, we checked our gaps, which were symmetric at 12.5 mm.  We then went ahead and completed resection of the posterior osteophytes off the posterior femur and the posterior tibia.  We then completed our tibial preparation with a modular drill and keel punch followed by the box cut with box cut guide.  Next, impacted our trial tibia and trial femur in place, we reduced with 12.5 poly and had excellent alignment, range of motion and stability.  We then resurfaced our patella going from a 28-mm thickness down to a 17-mm thickness with an oscillating saw and the patellar clamp.  We then drilled  our lug holes for 41 patella.  We placed our trial patellar button in place and ranged the knee.  We had excellent tracking with no-touch technique.  We then removed all trial components, pulse irrigated the knee, dried the bone and then used vacuum mixing techniques for the DePuy high-viscosity cement.  We cemented all the components in place, placed the knee in extension with 12.5 poly, size 5, and then went ahead and clamped the patella until the cement hardened.  We removed excess cement with 0.25- inch curved osteotome.  We trialed with a 15 as well  and we felt like the 15 get slightly better flexion and stability.  Stability was excellent even with the 12.5, but felt like we could use just a slightly thicker poly and we could gain extension, which we were able to do. With that 15 in place, we irrigated the knee thoroughly, final inspection for any loose debris, none encountered.  We closed the parapatellar arthrotomy with interrupted 0 Vicryl suture followed by 2-0 Vicryl for subcutaneous closure and 4-0 Monocryl for skin.  Steri-Strips applied followed by sterile dressing.  The patient tolerated the surgery well.     Almedia BallsSteven R. Ranell PatrickNorris, M.D.     SRN/MEDQ  D:  10/14/2015  T:  10/15/2015  Job:  [id number]

## 2015-10-15 NOTE — Progress Notes (Signed)
Orthopedics Progress Note  Subjective: Patient reports moderate pain this morning.  Objective:  Vitals:   10/14/15 2100 10/15/15 0615  BP: (!) 145/86 (!) 113/59  Pulse: 73 77  Resp: 16 16  Temp: 99.8 F (37.7 C) 98.9 F (37.2 C)    General: Awake and alert  Musculoskeletal: dressing clean and dry and intact, no pain with calf pumps Compartments supple, knee ROM 30-70 Neurovascularly intact  Lab Results  Component Value Date   WBC 13.6 (H) 10/15/2015   HGB 13.9 10/15/2015   HCT 43.0 10/15/2015   MCV 95.1 10/15/2015   PLT 219 10/15/2015       Component Value Date/Time   NA 134 (L) 10/15/2015 0348   K 4.5 10/15/2015 0348   CL 101 10/15/2015 0348   CO2 24 10/15/2015 0348   GLUCOSE 174 (H) 10/15/2015 0348   BUN 19 10/15/2015 0348   CREATININE 1.53 (H) 10/15/2015 0348   CALCIUM 9.0 10/15/2015 0348   GFRNONAA 45 (L) 10/15/2015 0348   GFRAA 52 (L) 10/15/2015 0348    Lab Results  Component Value Date   INR 1.05 10/15/2015    Assessment/Plan: POD #1 s/p Procedure(s): LEFT TOTAL KNEE ARTHROPLASTY PT, OT  pulm toilet, DVT prophylaxis-mechanical and chemical  Viviann SpareSteven R. Ranell PatrickNorris, MD 10/15/2015 10:03 AM

## 2015-10-15 NOTE — Progress Notes (Signed)
Physical Therapy Treatment Patient Details Name: Devon Williams MRN: [id number] DOB: 1945-06-07 Today's Date: 10/15/2015    History of Present Illness Patient admitted for elective TKR on left.  PMH significant for DM, chronic kidney disease    PT Comments    Patient continues to be limited by lethargy and grogginess which has limited ability to participate in therapy.  Continue to feel patient will need further inpatient therapy before returning home.  PT will continue to follow.  Follow Up Recommendations  SNF     Equipment Recommendations  Rolling walker with 5" wheels    Recommendations for Other Services       Precautions / Restrictions Precautions Precautions: Knee;Fall Required Braces or Orthoses: Knee Immobilizer - Left Knee Immobilizer - Left: Other (comment) (in bed except when in CPM) Restrictions Weight Bearing Restrictions: Yes LLE Weight Bearing: Weight bearing as tolerated    Mobility  Bed Mobility Overal bed mobility: Needs Assistance Bed Mobility: Sit to Supine Rolling: Mod assist;+2 for physical assistance Sidelying to sit: Mod assist;+2 for physical assistance   Sit to supine: Max assist;+2 for physical assistance   General bed mobility comments: patient too groggy to participate much  Transfers Overall transfer level: Needs assistance Equipment used: Rolling walker (2 wheeled) Transfers: Sit to/from [employer] Sit to Stand: Mod assist;+2 physical assistance Stand pivot transfers: Mod assist;+2 physical assistance       General transfer comment: required 2 attempts; max verbal and tactile cueing for sequencing and technique; only able to pivot to bed, unable to ambulate due to grogginess, difficulty following commands  Ambulation/Gait Ambulation/Gait assistance: Min assist;+2 physical assistance Ambulation Distance (Feet): 2 Feet Assistive device: Rolling walker (2 wheeled) Gait Pattern/deviations: Step-to pattern;Decreased  stance time - left;Decreased stride length;Antalgic Gait velocity: decreased   General Gait Details: limited ambulation due to lethargy and difficulty following commands.  Patient complained of dizziness during gait and thus returned to recliner.   Stairs            Wheelchair Mobility    Modified Rankin (Stroke Patients Only)       Balance Overall balance assessment: Needs assistance Sitting-balance support: No upper extremity supported Sitting balance-Leahy Scale: Poor Sitting balance - Comments: patient kept leaning forward with no attempt to self-correct   Standing balance support: Bilateral upper extremity supported;During functional activity Standing balance-Leahy Scale: Poor Standing balance comment: reliant on RW for balance/support                    Cognition Arousal/Alertness: Lethargic;Suspect due to medications (more groggy this pm session) Behavior During Therapy: Flat affect Overall Cognitive Status: Impaired/Different from baseline Area of Impairment: Following commands       Following Commands: Follows one step commands with increased time            Exercises      General Comments        Pertinent Vitals/Pain Pain Assessment: No/denies pain    Home Living Family/patient expects to be discharged to:: Private residence Living Arrangements: Spouse/significant other Available Help at Discharge: Family;Available 24 hours/day Type of Home: House Home Access: Stairs to enter Entrance Stairs-Rails: Right Home Layout: Two level;Able to live on main level with bedroom/bathroom Home Equipment: Gilmer Mor - single point      Prior Function Level of Independence: Independent      Comments: occasionally used cane   PT Goals (current goals can now be found in the care plan section) Acute Rehab PT Goals Patient Stated  Goal: wife reports to take patient home PT Goal Formulation: With patient/family Time For Goal Achievement:  10/22/15 Potential to Achieve Goals: Good Progress towards PT goals: Not progressing toward goals - comment (due to grogginess/lethargy)    Frequency    7X/week      PT Plan Current plan remains appropriate    Co-evaluation             End of Session Equipment Utilized During Treatment: Gait belt;Left knee immobilizer Activity Tolerance: Patient limited by lethargy Patient left: in bed;in CPM     Time: 1330-1406 PT Time Calculation (min) (ACUTE ONLY): 36 min  Charges:  $Gait Training: 23-37 mins                    G Codes:      Olivia CanterMoton, Aviance Cooperwood M 10/15/2015, 2:16 PM 10/15/2015 Corlis HoveMargie Nyari Olsson, PT [phone]

## 2015-10-15 NOTE — Progress Notes (Signed)
PT Cancellation Note  Patient Details Name: Devon SangerFred Williams MRN: [id number] DOB: 06/24/1945   Cancelled Treatment:    Reason Eval/Treat Not Completed: Fatigue/lethargy limiting ability to participate.  Attempted to perform exercises again this pm as wife anxious to do what Dr. Ranell PatrickNorris instructed this am (sit EOB and perform knee flexion).  Patient unable stay awake or follow commands enough to get sitting EOB.  Attemped exercises in bed with little success.  All exercises performed passively as patient unable to stay alert enough to participate.  Removed CPM from patient and put patient in BoneFoam to address extension.  Will resume PT tomorrow. Thank you,   Olivia CanterMoton, Tashema Tiller M 10/15/2015, 3:45 PM

## 2015-10-16 ENCOUNTER — Inpatient Hospital Stay (HOSPITAL_COMMUNITY): Payer: No Typology Code available for payment source

## 2015-10-16 DIAGNOSIS — R55 Syncope and collapse: Secondary | ICD-10-CM

## 2015-10-16 LAB — CBC
HEMATOCRIT: 38.7 % — AB (ref 39.0–52.0)
Hemoglobin: 12.2 g/dL — ABNORMAL LOW (ref 13.0–17.0)
MCH: 29.9 pg (ref 26.0–34.0)
MCHC: 31.5 g/dL (ref 30.0–36.0)
MCV: 94.9 fL (ref 78.0–100.0)
Platelets: 208 10*3/uL (ref 150–400)
RBC: 4.08 MIL/uL — ABNORMAL LOW (ref 4.22–5.81)
RDW: 13.6 % (ref 11.5–15.5)
WBC: 19 10*3/uL — ABNORMAL HIGH (ref 4.0–10.5)

## 2015-10-16 LAB — PROTIME-INR
INR: 1.28
Prothrombin Time: 16.1 seconds — ABNORMAL HIGH (ref 11.4–15.2)

## 2015-10-16 LAB — GLUCOSE, CAPILLARY
GLUCOSE-CAPILLARY: 203 mg/dL — AB (ref 65–99)
GLUCOSE-CAPILLARY: 196 mg/dL — AB (ref 65–99)
GLUCOSE-CAPILLARY: 181 mg/dL — AB (ref 65–99)
Glucose-Capillary: 169 mg/dL — ABNORMAL HIGH (ref 65–99)
Glucose-Capillary: 193 mg/dL — ABNORMAL HIGH (ref 65–99)

## 2015-10-16 MED ORDER — WARFARIN SODIUM 7.5 MG PO TABS
7.5000 mg | ORAL_TABLET | Freq: Once | ORAL | Status: AC
Start: 2015-10-16 — End: 2015-10-16
  Administered 2015-10-16: 7.5 mg via ORAL
  Filled 2015-10-16: qty 1

## 2015-10-16 MED ORDER — BROMFENAC SODIUM 0.07 % OP SOLN
1.0000 [drp] | Freq: Every day | OPHTHALMIC | Status: DC
  Administered 2015-10-16 – 2015-10-25 (×7): 1 [drp] via OPHTHALMIC

## 2015-10-16 MED ORDER — DIFLUPREDNATE 0.05 % OP EMUL
1.0000 [drp] | OPHTHALMIC | Status: DC
  Administered 2015-10-17 – 2015-10-25 (×12): 1 [drp] via OPHTHALMIC

## 2015-10-16 MED ORDER — BESIFLOXACIN HCL 0.6 % OP SUSP
1.0000 [drp] | Freq: Three times a day (TID) | OPHTHALMIC | Status: DC
Start: 2015-10-16 — End: 2015-10-26
  Administered 2015-10-16 – 2015-10-25 (×18): 1 [drp] via OPHTHALMIC

## 2015-10-16 NOTE — Progress Notes (Signed)
Patient and spouse have been reluctant for him to get pain med since patient was very lethargic during day shift yesterday.  Gave Percocet 1 tab PO this morning to assist with pain while in the CPM.

## 2015-10-16 NOTE — Progress Notes (Signed)
ANTICOAGULATION CONSULT NOTE - Initial Consult  Pharmacy Consult:  Coumadin Indication:  VTE prophylaxis  Patient Measurements: Weight: 277 lb (125.6 kg)  Labs:  Recent Labs  10/14/15 1825 10/15/15 0348 10/16/15 0228  HGB 14.0 13.9 12.2*  HCT 42.7 43.0 38.7*  PLT 205 219 208  LABPROT  --  13.7 16.1*  INR  --  1.05 1.28  CREATININE 1.44* 1.53*  --     Estimated Creatinine Clearance: 61.5 mL/min (by C-G formula based on SCr of 1.53 mg/dL (H)).   Medical History: Past Medical History:  Diagnosis Date  . Arthritis   . Chronic kidney disease   . Diabetes mellitus without complication (HCC)   . GERD (gastroesophageal reflux disease)   . 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 . Score per warfarin card is 5. Code stroke called this am, but tPA not administered. Frequent neuro checks and vital signs per note. Hgb down from 13.9 to 12.2, PLTC wnl. No s/sx of bleeding noted.  Goal of Therapy:  INR 2-3    Plan:  - Coumadin 7.5mg  PO today - Lovenox until INR >/= 1.8 - Daily PT / INR  Alfredo BachJoseph Arminger, BS, PharmD Clinical Pharmacy Resident [phone] (Pager) 10/16/2015 11:29 AM

## 2015-10-16 NOTE — Significant Event (Signed)
Rapid Response Event Note  Overview:  Called by Rn for patient with new slurred speech and facial droop Time Called: 0918 Arrival Time: 0922 Event Type: Neurologic  Initial Focused Assessment:  Called by Rn, Patient was working with PT when he suddenly had a short period of unresponsiveness ~15 seconds and when came to he was noted to have facial droop and slurred speech.  Patient denies SOB, CP, and states he was unable to respond to staff.  BP 90/46, HR 87, 91% on RA.  BP rechecked 100/58, Hr 87, sat 90% on ra placed on nasal cannula 2 lpm   Interventions:   Patient assisted back to bed, Code stroke called, NIHSS 4.  500 cc NS bolus started as per Dr. Amada JupiterKirkpatrick. CBG 193. Patient transferred to CT scan.   Stroke swallow screen done, passed  Plan of Care (if not transferred):  RN to monitor and obtain frequent neuro checks and vital signs.    Event Summary:  RN to call if assistance needed   at      at          [facility], Maryagnes Amosenise Ann

## 2015-10-16 NOTE — Consult Note (Signed)
Neurology Consultation Reason for Consult: Syncope Referring Physician: Code stroke, Ranell Patrickorris, S attending  CC: Syncope  History is obtained from: Nursing, wife  HPI: Devon Williams is a 70 y.o. male who is here having a total knee arthroplasty and was being walked by physical therapy for the first time following his procedure. He then had a brief episode of loss of consciousness with subsequent right facial droop. The loss of consciousness only lasted 10-15 seconds, but he was slurring his speech and apparently had some right arm weakness following the LOC.  He is being managed on Coumadin, but has an INR of 1.2. He is currently on Lovenox therapy as well.  BP was relatively low after standing, 90s systolic.   LKW: 9:15 am tpa given?: no, mild symptoms.      ROS: A 14 point ROS was performed and is negative except as noted in the HPI.   Past Medical History:  Diagnosis Date  . Arthritis   . Chronic kidney disease   . Diabetes mellitus without complication (HCC)   . GERD (gastroesophageal reflux disease)   . Hypertension      History reviewed. No pertinent family history.   Social History:  reports that he quit smoking about 39 years ago. His smoking use included Cigarettes. He smoked 1.00 pack per day. He quit smokeless tobacco use about 6 years ago. He reports that he does not drink alcohol or use drugs.   Exam: Current vital signs: BP 132/69 (BP Location: Left Arm)   Pulse 90   Temp 98.9 F (37.2 C) (Oral)   Resp 16   Wt 125.6 kg (277 lb)   SpO2 95%   BMI 38.63 kg/m  Vital signs in last 24 hours: Temp:  [97.9 F (36.6 C)-102.3 F (39.1 C)] 98.9 F (37.2 C) (09/24 1153) Pulse Rate:  [79-90] 90 (09/24 0920) Resp:  [16-17] 16 (09/24 1153) BP: (90-132)/(49-69) 132/69 (09/24 1153) SpO2:  [90 %-96 %] 95 % (09/24 1153)   Physical Exam  Constitutional: Appears well-developed and well-nourished.  Psych: Affect appropriate to situation Eyes: No scleral  injection HENT: No OP obstrucion Head: Normocephalic.  Cardiovascular: Normal rate and regular rhythm.  Respiratory: Effort normal and breath sounds normal to anterior ascultation GI: Soft.  No distension. There is no tenderness.  Skin: WDI  Neuro: Mental Status: Patient is awake, alert, oriented to person, place, month, year, and situation. Patient is able to give a clear and coherent history. No signs of aphasia or neglect Cranial Nerves: II: Visual Fields are full. Pupils are equal, round, and reactive to light.   III,IV, VI: EOMI without ptosis or diploplia.  V: Facial sensation is symmetric to temperature VII: Facial movement is possible mild right facial weakness, not definite VIII: hearing is intact to voice X: Uvula elevates symmetrically XI: Shoulder shrug is symmetric. XII: tongue is midline without atrophy or fasciculations.  Motor: Tone is normal. Bulk is normal. 5/5 strength was present in bilateral arms and right leg, limited in left leg due to pain Sensory: Sensation is symmetric to light touch and temperature in the arms and legs. Cerebellar: FNF with mild clumsiness in right arm, intact in left.    I have reviewed labs in epic and the results pertinent to this consultation are: Borderline creatinien  I have reviewed the images obtained:CT head - no acute findigns  Impression: 70 year old male with transient loss of consciousness with possible facial droop/dysarthria following this. My suspicion is that this represents syncope likely due to  orthostasis from prolonged bedrest. I do think an MRI is prudent and given that there was a question of focal symptoms, would also evaluate vasculature with carotid Dopplers/MRA head. Would not perform further stroke workup unless this is positive.  Recommendations: 1) MRI brain, MRA head 2) carotid Dopplers 3) stroke workup with above is positive.   Ritta Slot, MD [facility] [phone]  If  7pm- 7am, please page neurology on call as listed in AMION.

## 2015-10-16 NOTE — Progress Notes (Signed)
Orthopedic Tech Progress Note Patient Details:  Devon SangerFred Williams 06/04/1945 [id number]  Patient ID: Devon SangerFred Williams, male   DOB: 06/17/1945, 70 y.o.   MRN: [id number] Apply cpm 0-45  Trinna Williams, Devon Pick J 10/16/2015, 6:28 AM

## 2015-10-16 NOTE — Progress Notes (Signed)
   Subjective: 2 Days Post-Op Procedure(s) (LRB): LEFT TOTAL KNEE ARTHROPLASTY (Left) Patient reports pain as mild.   Patient seen in rounds with Dr. Lequita HaltAluisio. Patient is well, and has had no acute complaints or problems other than lethargy. No SOB or chest pain. No issues overnight. Voiding well. Positive flatus.    Objective: Vital signs in last 24 hours: Temp:  [98.7 F (37.1 C)-102.3 F (39.1 C)] 100.2 F (37.9 C) (09/24 0524) Pulse Rate:  [79-88] 81 (09/24 0524) Resp:  [16-17] 16 (09/24 0524) BP: (110-115)/(49-54) 113/54 (09/24 0524) SpO2:  [88 %-96 %] 96 % (09/24 0524)  Intake/Output from previous day:  Intake/Output Summary (Last 24 hours) at 10/16/15 0819 Last data filed at 10/16/15 0524  Gross per 24 hour  Intake                0 ml  Output              975 ml  Net             -975 ml     Labs:  Recent Labs  10/14/15 1825 10/15/15 0348 10/16/15 0228  HGB 14.0 13.9 12.2*    Recent Labs  10/15/15 0348 10/16/15 0228  WBC 13.6* 19.0*  RBC 4.52 4.08*  HCT 43.0 38.7*  PLT 219 208    Recent Labs  10/14/15 1825 10/15/15 0348  NA  --  134*  K  --  4.5  CL  --  101  CO2  --  24  BUN  --  19  CREATININE 1.44* 1.53*  GLUCOSE  --  174*  CALCIUM  --  9.0    Recent Labs  10/15/15 0348 10/16/15 0228  INR 1.05 1.28    EXAM General - Patient is Alert and Oriented Extremity - Neurologically intact Intact pulses distally Dorsiflexion/Plantar flexion intact No cellulitis present Compartment soft Dressing/Incision - clean, dry, no drainage Motor Function - intact, moving foot and toes well on exam.   Past Medical History:  Diagnosis Date  . Arthritis   . Chronic kidney disease   . Diabetes mellitus without complication (HCC)   . GERD (gastroesophageal reflux disease)   . Hypertension     Assessment/Plan: 2 Days Post-Op Procedure(s) (LRB): LEFT TOTAL KNEE ARTHROPLASTY (Left) Active Problems:   H/O total knee replacement  Estimated body  mass index is 38.63 kg/m as calculated from the following:   Height as of 10/04/15: 5\' 11"  (1.803 m).   Weight as of this encounter: 125.6 kg (277 lb). Advance diet Up with therapy D/C IV fluids Plan for discharge tomorrow  DVT Prophylaxis - Lovenox and Coumadin Weight-Bearing as tolerated   Continue therapy today. IV pain meds DC'd. Limit PO oxycodone due to lethargy. Continue CPM. [facility]. Encouraged incentive spirometer. Will watch WBC.    Dimitri PedAmber Azriella Mattia, PA-C Orthopaedic Surgery 10/16/2015, 8:19 AM

## 2015-10-16 NOTE — Progress Notes (Signed)
Physical Therapy Treatment Patient Details Name: Devon Williams MRN: [id number] DOB: May 06, 1945 Today's Date: 10/16/2015    History of Present Illness Patient admitted for elective TKR on left.  PMH significant for DM, chronic kidney disease    PT Comments    Noting more awake today than yesterday's session;   Able to take steps with 2 person assist; took steps towards window, then sat; Noted a period of unresponsiveness once seated; eyes were open, breathing and pulse present; tongue protrusion noted as well; reclined pt to near supine; noted facial assymetry as well and notified RN;  BP 90/59, 91% RA, pulse 90 and respiration 16; Rapid Response called; noted facial droop, slurred speech, ataxia, and Code Stroke was initiated; Assisted pt back to bed (placed foot in Bone Foam), Neurologist arrived as well, and pt was prepared to go to imaging  Follow Up Recommendations  SNF     Equipment Recommendations  Rolling walker with 5" wheels    Recommendations for Other Services       Precautions / Restrictions Precautions Precautions: Knee;Fall Precaution Booklet Issued: Yes (comment); Will need more reinforcement of education Required Braces or Orthoses: Knee Immobilizer - Left Restrictions LLE Weight Bearing: Weight bearing as tolerated    Mobility  Bed Mobility Overal bed mobility: Needs Assistance Bed Mobility: Supine to Sit     Supine to sit: +2 for physical assistance;Mod assist Sit to supine: Mod assist   General bed mobility comments: Cues for technique; heavy mod assist to elevate trunk to sit; noting initiated reciprocal scooting with cues, inefficient scoot noted; heavy mod assist to help LEs onto bed to lay back down  Transfers Overall transfer level: Needs assistance Equipment used: Rolling walker (2 wheeled) Transfers: Sit to/from [employer] Sit to Stand: +2 physical assistance;Mod assist;Max assist Stand pivot transfers: Mod assist;+2 physical  assistance       General transfer comment: Cues for hand placement and technique; needing max assist and cues to lean forward and fully extend standing leg and extend hips and trunk to reach full standing; heavy mod assist of 2 to rise from the chair and pivot back to bed  Ambulation/Gait Ambulation/Gait assistance: +2 physical assistance;Mod assist Ambulation Distance (Feet): 8 Feet Assistive device: Rolling walker (2 wheeled) Gait Pattern/deviations: Step-to pattern;Trunk flexed;Decreased step length - right;Decreased stance time - left Gait velocity: decreased   General Gait Details: Max, near total cues for sequence with taking steps; trunk significantly flexed; physical assist to advance LLE, extremely short steps RLE; Rw tending to get to far in front   Stairs            Wheelchair Mobility    Modified Rankin (Stroke Patients Only)       Balance     Sitting balance-Leahy Scale:  (approaching Fair)       Standing balance-Leahy Scale: Poor                      Cognition Arousal/Alertness: Awake/alert (More awake than yesterday; still somewhat sleepy) Behavior During Therapy: Flat affect Overall Cognitive Status: Impaired/Different from baseline Area of Impairment: Following commands       Following Commands: Follows one step commands with increased time (and heavy cueing)            Exercises Total Joint Exercises Quad Sets: AAROM;Left;5 reps Heel Slides: AAROM;Left;5 reps Straight Leg Raises: AAROM;Left;5 reps Goniometric ROM: AAROM approx 5-50 deg    General Comments  Pertinent Vitals/Pain Pain Assessment: Faces Faces Pain Scale: Hurts whole lot Pain Location:  L knee at end range flexion Pain Descriptors / Indicators: Grimacing;Guarding Pain Intervention(s): Premedicated before session;Repositioned;Monitored during session    Home Living                      Prior Function            PT Goals (current goals  can now be found in the care plan section) Acute Rehab PT Goals Patient Stated Goal: wife reports to take patient home PT Goal Formulation: With patient/family Time For Goal Achievement: 10/22/15 Potential to Achieve Goals: Good Progress towards PT goals: Progressing toward goals    Frequency    7X/week      PT Plan Current plan remains appropriate    Co-evaluation             End of Session Equipment Utilized During Treatment: Gait belt;Left knee immobilizer Activity Tolerance: Other (comment) (noted facial droop and ataxia; Rapid Response called) Patient left: in bed;with nursing/sitter in room;Other (comment) (prepping to go to CT)     Time: [id number] PT Time Calculation (min) (ACUTE ONLY): 33 min  Charges:  $Gait Training: 8-22 mins $Therapeutic Activity: 8-22 mins                    G Codes:      Van ClinesGarrigan, Ellery Tash Hamff 10/16/2015, 10:00 AM   Van ClinesHolly Lisett Dirusso, PT  Acute Rehabilitation Services Pager [phone] Office [phone]

## 2015-10-16 NOTE — Progress Notes (Signed)
Charge RN contacted by physical therapy during session because patient had a period of non-responsiveness.  Facial droop and slurred speech noted, rapid response contacted for further evaluation.  BP 90/59, 91% RA, pulse 90 and respiration 16.

## 2015-10-17 ENCOUNTER — Inpatient Hospital Stay (HOSPITAL_COMMUNITY): Payer: No Typology Code available for payment source

## 2015-10-17 ENCOUNTER — Encounter (HOSPITAL_COMMUNITY): Payer: Self-pay | Admitting: Orthopedic Surgery

## 2015-10-17 DIAGNOSIS — N183 Chronic kidney disease, stage 3 unspecified: Secondary | ICD-10-CM | POA: Diagnosis present

## 2015-10-17 DIAGNOSIS — E1122 Type 2 diabetes mellitus with diabetic chronic kidney disease: Secondary | ICD-10-CM

## 2015-10-17 DIAGNOSIS — G9340 Encephalopathy, unspecified: Secondary | ICD-10-CM

## 2015-10-17 DIAGNOSIS — I10 Essential (primary) hypertension: Secondary | ICD-10-CM | POA: Diagnosis present

## 2015-10-17 DIAGNOSIS — Z96652 Presence of left artificial knee joint: Secondary | ICD-10-CM

## 2015-10-17 DIAGNOSIS — E1129 Type 2 diabetes mellitus with other diabetic kidney complication: Secondary | ICD-10-CM | POA: Diagnosis present

## 2015-10-17 LAB — CBC
HCT: 34.8 % — ABNORMAL LOW (ref 39.0–52.0)
HEMOGLOBIN: 11.5 g/dL — AB (ref 13.0–17.0)
MCH: 30.4 pg (ref 26.0–34.0)
MCHC: 33 g/dL (ref 30.0–36.0)
MCV: 92.1 fL (ref 78.0–100.0)
PLATELETS: 185 10*3/uL (ref 150–400)
RBC: 3.78 MIL/uL — AB (ref 4.22–5.81)
RDW: 13.5 % (ref 11.5–15.5)
WBC: 13.9 10*3/uL — AB (ref 4.0–10.5)

## 2015-10-17 LAB — GLUCOSE, CAPILLARY
GLUCOSE-CAPILLARY: 120 mg/dL — AB (ref 65–99)
Glucose-Capillary: 178 mg/dL — ABNORMAL HIGH (ref 65–99)
Glucose-Capillary: 188 mg/dL — ABNORMAL HIGH (ref 65–99)
Glucose-Capillary: 145 mg/dL — ABNORMAL HIGH (ref 65–99)

## 2015-10-17 LAB — LIPID PANEL
CHOLESTEROL: 131 mg/dL (ref 0–200)
HDL: 44 mg/dL (ref >40)
LDL CALC: 64 mg/dL (ref 0–99)
TRIGLYCERIDES: 113 mg/dL (ref <150)
Total CHOL/HDL Ratio: 3 RATIO
VLDL: 23 mg/dL (ref 0–40)

## 2015-10-17 LAB — HEMOGLOBIN A1C
HEMOGLOBIN A1C: 7.2 % — AB (ref 4.8–5.6)
MEAN PLASMA GLUCOSE: 160 mg/dL

## 2015-10-17 LAB — PROTIME-INR
INR: 1.16
PROTHROMBIN TIME: 14.8 s (ref 11.4–15.2)

## 2015-10-17 LAB — LACTIC ACID, PLASMA: Lactic Acid, Venous: 1.3 mmol/L (ref 0.5–1.9)

## 2015-10-17 LAB — AMMONIA: AMMONIA: 16 umol/L (ref 9–35)

## 2015-10-17 LAB — TROPONIN I: Troponin I: <0.03 ng/mL (ref <0.03)

## 2015-10-17 MED ORDER — SODIUM CHLORIDE 0.9 % IV SOLN
INTRAVENOUS | Status: DC
  Administered 2015-10-18: via INTRAVENOUS

## 2015-10-17 MED ORDER — WARFARIN SODIUM 5 MG PO TABS
10.0000 mg | ORAL_TABLET | Freq: Once | ORAL | Status: AC
  Administered 2015-10-17: 10 mg via ORAL
  Filled 2015-10-17: qty 2

## 2015-10-17 NOTE — Consult Note (Addendum)
Medical Consultation   Devon Williams  ZOX:[id number]  DOB: [birth year]  DOA: 10/14/2015  PCP: Elijio Miles, MD [facility] Outpatient Specialists: [facility] Nephrology [facility]   Requesting physician: Dr.Norris  Reason for consultation: Acute encephalopathy   History of Present Illness: Devon Williams is an 70 y.o. male past history of hyperlipidemia, HTN, carotid artery disease, GERD, depression, diabetes, CKD III followed by nephrology at [facility]  On September 22 patient undergone left knee arthroplasty secondary to end-stage arthritis with total knee replacement, by Dr. Ranell Patrick under general anesthesia. Personally patient remained lethargic and per family groggy which is not his baseline. He hasn't been able to pretty is a patent therapy. Patient required 2 people assist to stand up he have had minimal narcotic use as his family was reluctant secondary to significant lethargy. On 24th of September patient had an episode of transient poor responsiveness was worrisome for possible facial droop and slurred speech associated this blood pressure was 90/59 satting 91% room air pulse 90 this occurred while patient was ambulating with physical therapy. Was associated with brief episode of loss of consciousness. Lasting only 10-15 seconds perhaps associated right arm weakness. Code stroke was initiated She was on Coumadin at that time INR 1.2 bridge with Lovenox. Patient has undergone extensive neurological workup including CT, MRI MRA of the head, carotid Dopplers. No evidence of CVA preliminary report showed 1-39% ICA plaque vertebral artery flow was anterograde. The symptoms has been associated with some positional hypotension appear to be orthostatic. His fluids were increased. He have not endorsed a chest pain no lower extremity swelling. Patient has known history of diabetes mellitus and pain on metformin and Tradjenta  hemoglobin A1c in December 2016 was 6.1 his known history of  depression on  Effexor was started on Latuda 6 months ago.  Wife states he has been clear minded until the admission.  Reports recently have developed low grade fevers. No cough he feels light headed when he stands up. No dysuria  Family reporots no hx of heavy EtOH abuse.  Deneis leg swelling.  Regarding patient's history of chronic kidney disease his creatinine was 1.28 on 09/20/2015  Review of Systems:  ROSpositive for confusion and light headedness.  As per HPI otherwise 10 point review of systems negative.     Past Medical History: Past Medical History:  Diagnosis Date  . Arthritis   . Chronic kidney disease   . Diabetes mellitus without complication (HCC)   . GERD (gastroesophageal reflux disease)   . Hypertension     Past Surgical History: Past Surgical History:  Procedure Laterality Date  . EYE SURGERY Left    cataract-to have rt done 09/04/15  . GANGLION CYST EXCISION Right    hand  . KNEE ARTHROSCOPY Left    x3  . TOTAL KNEE ARTHROPLASTY Left 10/14/2015   Procedure: LEFT TOTAL KNEE ARTHROPLASTY;  Surgeon: Beverely Low, MD;  Location: [facility] OR;  Service: Orthopedics;  Laterality: Left;     Allergies:   Allergies  Allergen Reactions  . Nabumetone Other (See Comments)    UNSPECIFIED REACTION      Social History:  reports that he quit smoking about 39 years ago. His smoking use included Cigarettes. He smoked 1.00 pack per day. He quit smokeless tobacco use about 6 years ago. He reports that he does not drink alcohol or use drugs.   Family History: Family History  Problem Relation  Age of Onset  . Diabetes Mother   . Ovarian cancer Mother   . Lung cancer Father        Physical Exam: Vitals:   10/17/15 1032 10/17/15 1439 10/17/15 1754 10/17/15 1803  BP: (!) 82/40 110/70 (!) 132/56 (!) 152/67  Pulse: 86 88 87 98  Resp: 18 18 18 16   Temp: 97.9 F (36.6 C) 98.6 F (37 C) 98.8 F (37.1 C) 97.7 F (36.5 C)  TempSrc: Oral Oral Oral Oral  SpO2: 94% 100%  96% 98%  Weight:        Constitutional:  Alert and awake, oriented x3, not in any acute distress. Eyes: PERLA, EOMI, irises appear normal, anicteric sclera,  ENMT: external ears and nose appear normal, hard of hearing            Lips appears normal, oropharynx mucosa dry, tongue dry, posterior pharynx appear normal  Neck: neck appears normal, no masses, normal ROM, no thyromegaly, no JVD  CVS: S1-S2 clear, no murmur rubs or gallops, no LE edema, normal pedal pulses  Respiratory:  no wheezing, rales or rhonchi sound distant breath sounds. Respiratory effort normal. No accessory muscle use.  Abdomen: soft nontender, nondistended, normal bowel sounds, no hepatosplenomegaly, no hernias  Musculoskeletal: : no cyanosis, clubbing or edema noted bilaterally                       left knee in immobilizer Neuro: Cranial nerves II-XII intact, strength, sensation, reflexes Psych: judgement and insight appear normal, stable mood and affect, mental status Skin: no rashes or lesions or ulcers, no induration or nodules     Data reviewed:  I have personally reviewed following labs and imaging studies Labs:  CBC:  Recent Labs Lab 10/14/15 1825 10/15/15 0348 10/16/15 0228 10/17/15 0503  WBC 13.9* 13.6* 19.0* 13.9*  HGB 14.0 13.9 12.2* 11.5*  HCT 42.7 43.0 38.7* 34.8*  MCV 94.3 95.1 94.9 92.1  PLT 205 219 208 185    Basic Metabolic Panel:  Recent Labs Lab 10/14/15 1825 10/15/15 0348  NA  --  134*  K  --  4.5  CL  --  101  CO2  --  24  GLUCOSE  --  174*  BUN  --  19  CREATININE 1.44* 1.53*  CALCIUM  --  9.0   GFR Estimated Creatinine Clearance: 61.5 mL/min (by C-G formula based on SCr of 1.53 mg/dL (H)). Liver Function Tests: No results for input(s): AST, ALT, ALKPHOS, BILITOT, PROT, ALBUMIN in the last 168 hours. No results for input(s): LIPASE, AMYLASE in the last 168 hours. No results for input(s): AMMONIA in the last 168 hours. Coagulation profile  Recent Labs Lab  10/15/15 0348 10/16/15 0228 10/17/15 0503  INR 1.05 1.28 1.16    Cardiac Enzymes: No results for input(s): CKTOTAL, CKMB, CKMBINDEX, TROPONINI in the last 168 hours. BNP: Invalid input(s): POCBNP CBG:  Recent Labs Lab 10/16/15 1639 10/16/15 2051 10/17/15 0631 10/17/15 1154 10/17/15 1640  GLUCAP 196* 181* 178* 188* 120*   D-Dimer No results for input(s): DDIMER in the last 72 hours. Hgb A1c No results for input(s): HGBA1C in the last 72 hours. Lipid Profile  Recent Labs  10/17/15 0503  CHOL 131  HDL 44  LDLCALC 64  TRIG 113  CHOLHDL 3.0   Thyroid function studies No results for input(s): TSH, T4TOTAL, T3FREE, THYROIDAB in the last 72 hours.  Invalid input(s): FREET3 Anemia work up No results for input(s): VITAMINB12, FOLATE, FERRITIN,  TIBC, IRON, RETICCTPCT in the last 72 hours. Urinalysis No results found for: COLORURINE, APPEARANCEUR, LABSPEC, PHURINE, GLUCOSEU, HGBUR, BILIRUBINUR, KETONESUR, PROTEINUR, UROBILINOGEN, NITRITE, LEUKOCYTESUR   Microbiology No results found for this or any previous visit (from the past 240 hour(s)).    Inpatient Medications:   Scheduled Meds: . acetaminophen  1,000 mg Oral BID  . aspirin EC  81 mg Oral QHS  . atorvastatin  80 mg Oral Daily  . Besifloxacin HCl  1 drop Right Eye TID  . Bromfenac Sodium  1 drop Ophthalmic QHS  . carvedilol  6.25 mg Oral BID WC  . cholecalciferol  2,000 Units Oral Daily  . coumadin book   Does not apply Once  . Difluprednate  1 drop Left Eye 2 times per day  . docusate sodium  100 mg Oral BID  . enoxaparin (LOVENOX) injection  30 mg Subcutaneous Q12H  . ferrous sulfate  325 mg Oral TID PC  . glipiZIDE  10 mg Oral BID  . insulin aspart  0-20 Units Subcutaneous TID WC  . insulin aspart  0-5 Units Subcutaneous QHS  . insulin aspart  6 Units Subcutaneous TID WC  . linagliptin  5 mg Oral Daily  . lisinopril  10 mg Oral QHS  . loratadine  10 mg Oral Daily  . lurasidone  40 mg Oral QHS  .  omega-3 acid ethyl esters  2 g Oral BID  . pantoprazole  80 mg Oral Daily  . tiZANidine  2 mg Oral QHS  . tranexamic acid (CYKLOKAPRON) topical -INTRAOP  2,000 mg Topical Once  . venlafaxine XR  150 mg Oral BID  . Warfarin - Pharmacist Dosing Inpatient   Does not apply q1800   Continuous Infusions: . sodium chloride 50 mL/hr at 10/17/15 1041  . lactated ringers 10 mL/hr at 10/14/15 1157     Radiological Exams on Admission: Mr Shirlee Latch ZO Contrast  Result Date: 10/16/2015 CLINICAL DATA:  Brief episode of loss of consciousness followed by a right-sided facial droop, slurred speech, and right arm weakness. Recent total knee arthroplasty. EXAM: MRI HEAD WITHOUT CONTRAST MRA HEAD WITHOUT CONTRAST TECHNIQUE: Multiplanar, multiecho pulse sequences of the brain and surrounding structures were obtained without intravenous contrast. Angiographic images of the head were obtained using MRA technique without contrast. COMPARISON:  Head CT 10/16/2015 FINDINGS: MRI HEAD FINDINGS Brain: There is no evidence of acute infarct, intracranial hemorrhage, mass, midline shift, or extra-axial fluid collection. The ventricles and sulci are within normal limits for age. Scattered T2 hyperintensities in the subcortical and deep cerebral white matter and pons are nonspecific but compatible with mild chronic small vessel ischemic disease. Vascular: Major intracranial vascular flow voids are preserved. Skull and upper cervical spine: Unremarkable bone marrow signal. Sinuses/Orbits: Prior bilateral cataract extraction. Left maxillary sinus mucous retention cyst. Clear mastoid air cells. Other: None. MRA HEAD FINDINGS The visualized distal vertebral arteries are widely patent with the left being slightly dominant. Right PICA and left AICA origins are patent. Basilar artery is widely patent. SCA origins are patent. There is a patent right posterior communicating artery. PCAs are patent without evidence of major branch occlusion or  significant proximal stenosis. Internal carotid arteries are widely patent from skullbase to carotid termini. ACAs and MCAs are patent without evidence of major branch occlusion or significant proximal stenosis. Left ACA is dominant. No intracranial aneurysm is identified. IMPRESSION: 1. No acute intracranial abnormality. 2. Mild chronic small vessel ischemic disease. 3. Negative head MRA. Electronically Signed   By:  Sebastian Ache M.D.   On: 10/16/2015 14:55   Mr Brain Wo Contrast  Result Date: 10/16/2015 CLINICAL DATA:  Brief episode of loss of consciousness followed by a right-sided facial droop, slurred speech, and right arm weakness. Recent total knee arthroplasty. EXAM: MRI HEAD WITHOUT CONTRAST MRA HEAD WITHOUT CONTRAST TECHNIQUE: Multiplanar, multiecho pulse sequences of the brain and surrounding structures were obtained without intravenous contrast. Angiographic images of the head were obtained using MRA technique without contrast. COMPARISON:  Head CT 10/16/2015 FINDINGS: MRI HEAD FINDINGS Brain: There is no evidence of acute infarct, intracranial hemorrhage, mass, midline shift, or extra-axial fluid collection. The ventricles and sulci are within normal limits for age. Scattered T2 hyperintensities in the subcortical and deep cerebral white matter and pons are nonspecific but compatible with mild chronic small vessel ischemic disease. Vascular: Major intracranial vascular flow voids are preserved. Skull and upper cervical spine: Unremarkable bone marrow signal. Sinuses/Orbits: Prior bilateral cataract extraction. Left maxillary sinus mucous retention cyst. Clear mastoid air cells. Other: None. MRA HEAD FINDINGS The visualized distal vertebral arteries are widely patent with the left being slightly dominant. Right PICA and left AICA origins are patent. Basilar artery is widely patent. SCA origins are patent. There is a patent right posterior communicating artery. PCAs are patent without evidence of major  branch occlusion or significant proximal stenosis. Internal carotid arteries are widely patent from skullbase to carotid termini. ACAs and MCAs are patent without evidence of major branch occlusion or significant proximal stenosis. Left ACA is dominant. No intracranial aneurysm is identified. IMPRESSION: 1. No acute intracranial abnormality. 2. Mild chronic small vessel ischemic disease. 3. Negative head MRA. Electronically Signed   By: Sebastian Ache M.D.   On: 10/16/2015 14:55   Ct Head Code Stroke Wo Contrast  Result Date: 10/16/2015 CLINICAL DATA:  Code stroke. Slurred speech and right-sided facial droop. EXAM: CT HEAD WITHOUT CONTRAST TECHNIQUE: Contiguous axial images were obtained from the base of the skull through the vertex without intravenous contrast. COMPARISON:  None. FINDINGS: Brain: There is no evidence of acute cortical infarct, intracranial hemorrhage, mass, midline shift, or extra-axial fluid collection. There is mild generalized cerebral atrophy. Vascular: No hyperdense vessel or unexpected calcification. Skull: No focal osseous lesion or fracture. Sinuses/Orbits: Left maxillary sinus mucous retention cyst or polyp. Prior bilateral cataract extraction. Other: None. ASPECTS [facility] Stroke Program Early CT Score) - Ganglionic level infarction (caudate, lentiform nuclei, internal capsule, insula, M1-M3 cortex): 7 - Supraganglionic infarction (M4-M6 cortex): 3 Total score (0-10 with 10 being normal): 10 IMPRESSION: 1. No evidence of acute intracranial abnormality. 2. ASPECTS is 10. These results were called by telephone at the time of interpretation on 10/16/2015 at 10:03 am to Dr. Ritta Slot , who verbally acknowledged these results. Electronically Signed   By: Sebastian Ache M.D.   On: 10/16/2015 10:05    Impression/Recommendations  70 y.o. male past history of hyperlipidemia, HTN, carotid artery disease, GERD, depression, diabetes, CKD III followed by nephrology at [facility] recently  admitted for left knee replacement has developed perioperative acute encephalopathy and low-grade fevers with worsening renal function from prior medicine consult was called to evaluate for above   Active Problems:   H/O total knee replacement as per primary team      Acute encephalopathy likely multifactorial in the setting of recent anesthesia appears to be also fluid down. Patient had been having low-grade fevers will obtain for any evidence of infectious etiology. Obtain chest x-ray and UA. For completion sake we'll  obtain VBG and ammonia level patient denies alcohol abuse. Hopefully mental status will improve with rehydration   DM (diabetes mellitus), type 2 with renal complications (HCC) patient on sliding scale blood sugars have been stable. Even with a poor by mouth intake would hold by mouth home medications until able to tolerate diet   CKD (chronic kidney disease), stage III - cr worse from baseline patient appears dry with decreased PO intake, will hold lisinopril, rehydrate, check urine elctrolytes  Essential hypertension given soft blood pressures and rising creatinine withhold lisinopril Low-grade fevers will evaluate for any evidence of infectious etiology  Thank you for this consultation.  [facility] will follow the patient with you.   Time Spent: 65 min  Whyatt Klinger M.D. [facility] 10/17/2015, 8:05 PM

## 2015-10-17 NOTE — Progress Notes (Signed)
Patient BP WDL when sitting.  BP drops when and after ambulating. Restarted on Fluids, will continue to monitor.

## 2015-10-17 NOTE — Progress Notes (Signed)
VASCULAR LAB PRELIMINARY  PRELIMINARY  PRELIMINARY  PRELIMINARY  Carotid duplex completed.    Preliminary report:  1-39% ICA plaquing.  Vertebral artery flow is antegrade.   Zair Borawski, RVT 10/17/2015, 2:00 PM

## 2015-10-17 NOTE — Progress Notes (Signed)
OT Cancellation Note  Patient Details Name: Devon Williams MRN: [id number] DOB: 08/11/1945   Cancelled Treatment:    Reason Eval/Treat Not Completed: Patient at procedure or test/ unavailable (Vascular)  Doristine Sectionharity A Rasaan Brotherton, OTR/L [phone] 10/17/2015, 2:11 PM

## 2015-10-17 NOTE — Progress Notes (Signed)
Physical Therapy Treatment Patient Details Name: Devon Williams MRN: [id number] DOB: 1945/02/22 Today's Date: 10/17/2015    History of Present Illness Patient admitted for elective TKR on left.  PMH significant for DM, chronic kidney disease    PT Comments    Pt remains lethargic throughout session, but will respond with direct, firm cues.  Pt's BP 116/63 supine, 102/46 sitting EOB, 93/51 standing, and 118/55 sitting in recliner.  Pt indicates "a little dizzy" EOB, but denied dizziness in standing.  No changes in level of arousal throughout session.  Wife present and discussing with PT concern over pt's lethargy since surgery.  At this time feel SNf is safest D/C venue for pt.  Will continue to follow.    Follow Up Recommendations  SNF     Equipment Recommendations  Rolling walker with 5" wheels    Recommendations for Other Services       Precautions / Restrictions Precautions Precautions: Knee;Fall Required Braces or Orthoses: Knee Immobilizer - Left Knee Immobilizer - Left: Discontinue once straight leg raise with < 10 degree lag Restrictions Weight Bearing Restrictions: Yes LLE Weight Bearing: Weight bearing as tolerated    Mobility  Bed Mobility Overal bed mobility: Needs Assistance;+2 for physical assistance Bed Mobility: Supine to Sit     Supine to sit: Mod assist;+2 for physical assistance;HOB elevated     General bed mobility comments: pt needs A for L LE, scooting hips to EOB and with bringing trunk up to sitting.  pt does attempt to follow cueing, but starts to fall asleep when not stimulated.    Transfers Overall transfer level: Needs assistance Equipment used: Rolling walker (2 wheeled) Transfers: Sit to/from [employer] Sit to Stand: Max assist;+2 physical assistance Stand pivot transfers: Mod assist;+2 physical assistance       General transfer comment: cues for UE use and positioning of LEs.  pt needs facilitation for anterior weightshift  with coming to standing and needs heavy A on L side to A with getting pt's L LE under him and for more erect posture.    Ambulation/Gait                 Stairs            Wheelchair Mobility    Modified Rankin (Stroke Patients Only)       Balance Overall balance assessment: Needs assistance Sitting-balance support: Bilateral upper extremity supported;Feet supported Sitting balance-Leahy Scale: Poor   Postural control: Left lateral lean Standing balance support: Bilateral upper extremity supported;During functional activity Standing balance-Leahy Scale: Poor                      Cognition Arousal/Alertness: Lethargic Behavior During Therapy: Flat affect Overall Cognitive Status: Impaired/Different from baseline Area of Impairment: Orientation;Attention;Memory;Following commands;Safety/judgement;Awareness;Problem solving Orientation Level: Disoriented to;Time;Place Current Attention Level: Sustained Memory: Decreased short-term memory Following Commands: Follows one step commands with increased time Safety/Judgement: Decreased awareness of safety;Decreased awareness of deficits Awareness: Intellectual Problem Solving: Slow processing;Decreased initiation;Difficulty sequencing;Requires verbal cues;Requires tactile cues General Comments: pt has moments when he is oriented, but then will make comments asking his wife to go outside and get his granddaighter.  pt very lethargic throughout session, but does respond to direct firm cueing and pain.  Per wife this is not baseline.      Exercises Total Joint Exercises Quad Sets: AROM;Left;10 reps Long Arc Quad: AAROM;Left;10 reps Knee Flexion: AAROM;Left;10 reps Goniometric ROM: AAROM ~ 10 - 50    General  Comments        Pertinent Vitals/Pain Pain Assessment: Faces Faces Pain Scale: Hurts even more Pain Location: pt initially denied pain when asked, but then stated he was having pain when asked about  dizziness.  Grimaces during mobility Pain Descriptors / Indicators: Grimacing Pain Intervention(s): Monitored during session;Repositioned    Home Living                      Prior Function            PT Goals (current goals can now be found in the care plan section) Acute Rehab PT Goals Patient Stated Goal: Per wife for pt to fully recover. PT Goal Formulation: With patient/family Time For Goal Achievement: 10/22/15 Potential to Achieve Goals: Good Progress towards PT goals: Progressing toward goals    Frequency    7X/week      PT Plan Current plan remains appropriate    Co-evaluation             End of Session Equipment Utilized During Treatment: Gait belt;Left knee immobilizer Activity Tolerance: Patient limited by lethargy Patient left: in chair;with call bell/phone within reach;with family/visitor present     Time: 0921-1005 PT Time Calculation (min) (ACUTE ONLY): 44 min  Charges:  $Therapeutic Exercise: 8-22 mins $Therapeutic Activity: 23-37 mins                    G CodesSunny Schlein:      Shalaya Swailes F, [place] CarolinaPT [phone] 10/17/2015, 10:35 AM

## 2015-10-17 NOTE — Progress Notes (Signed)
Orthopedic Tech Progress Note Patient Details:  Devon SangerFred Williams 10/07/1945 [id number]  Patient ID: Devon SangerFred Williams, male   DOB: 03/29/1945, 70 y.o.   MRN: [id number] Applied cpm 0-45.  [name], [name] 10/17/2015, 6:34 AM

## 2015-10-17 NOTE — Progress Notes (Signed)
Orthopedics Progress Note  Subjective: Patient and family report still not feeling fully awake after his anesthesia on Friday.   Objective:  Vitals:   10/17/15 1754 10/17/15 1803  BP: (!) 132/56 (!) 152/67  Pulse: 87 98  Resp: 18 16  Temp: 98.8 F (37.1 C) 97.7 F (36.5 C)    General: Awake and alert to person place and time Musculoskeletal: left knee dressing CDI, no erythema, no cords, neg Homan's sign Neurovascularly intact  Lab Results  Component Value Date   WBC 13.9 (H) 10/17/2015   HGB 11.5 (L) 10/17/2015   HCT 34.8 (L) 10/17/2015   MCV 92.1 10/17/2015   PLT 185 10/17/2015       Component Value Date/Time   NA 134 (L) 10/15/2015 0348   K 4.5 10/15/2015 0348   CL 101 10/15/2015 0348   CO2 24 10/15/2015 0348   GLUCOSE 174 (H) 10/15/2015 0348   BUN 19 10/15/2015 0348   CREATININE 1.53 (H) 10/15/2015 0348   CALCIUM 9.0 10/15/2015 0348   GFRNONAA 45 (L) 10/15/2015 0348   GFRAA 52 (L) 10/15/2015 0348    Lab Results  Component Value Date   INR 1.16 10/17/2015   INR 1.28 10/16/2015   INR 1.05 10/15/2015    Assessment/Plan: POD #3 s/p Procedure(s): LEFT TOTAL KNEE ARTHROPLASTY Patient lethargic after TKR on Friday.  He had positive LOC with a Code Stroke called. Work-up to this point negative for stroke. Neurology consulted. Patient has had no pain medications today due to AMS. There has been continued positional hypotension and elevated heart rate. Will increase fluids slightly and ask internal medicine to see patient for eval of any other causes for AMS. Of note the patient is still alert to person place and time and follows commands easily.  He is just sleepy and not "himself" O2 Sats have been 94% on room air. PT following - we need to continue to be aggressive with his knee therapy   Almedia BallsSteven R. Ranell PatrickNorris, MD 10/17/2015 6:13 PM

## 2015-10-17 NOTE — Progress Notes (Signed)
ANTICOAGULATION CONSULT NOTE  Pharmacy Consult:  Coumadin Indication:  VTE prophylaxis  Patient Measurements: Weight: 277 lb (125.6 kg)  Labs:  Recent Labs  10/14/15 1825 10/15/15 0348 10/16/15 0228 10/17/15 0503  HGB 14.0 13.9 12.2* 11.5*  HCT 42.7 43.0 38.7* 34.8*  PLT 205 219 208 185  LABPROT  --  13.7 16.1* 14.8  INR  --  1.05 1.28 1.16  CREATININE 1.44* 1.53*  --   --     Estimated Creatinine Clearance: 61.5 mL/min (by C-G formula based on SCr of 1.53 mg/dL (H)).   Medical History: Past Medical History:  Diagnosis Date  . Arthritis   . Chronic kidney disease   . Diabetes mellitus without complication (HCC)   . GERD (gastroesophageal reflux disease)   . Hypertension     Assessment: [age] YOM s/p left TKA on coumadin/lovenox for VTE px s/p left TKA on 9/22, no anticoagulation PTA. INR slow to rise, remains subtherapeutic.   Code stroke called 9/24, but tPA not administered. Frequent neuro checks and vital signs per note.   Hgb down from 13.9 to 11.5, PLTC wnl. No s/sx of bleeding noted.  Goal of Therapy:  INR 2-3    Plan:  - Coumadin 10 mg PO today - Lovenox until INR >/= 1.8 - Daily PT / INR  Tad MooreJessica Emari Demmer, Pharm D, BCPS  Clinical Pharmacist Pager [phone]  10/17/2015 1:20 PM

## 2015-10-17 NOTE — Discharge Instructions (Addendum)
Ice to the knee at all times.  Keep the incision clean and covered and dry for one week, then ok to get it wet in the shower.  Do not prop anything behind the knee, prop under the heel to encourage extension  Use the immobilizer at night to maintain knee extension, may remove during the day.  Use home CPM 0-90 degrees, 6 hours per day, in two hour increments  Follow up in two weeks with Dr Ranell Patrick  [phone]   Information on my medicine - Coumadin   (Warfarin)  This medication education was reviewed with me or my healthcare representative as part of my discharge preparation.  The pharmacist that spoke with me during my hospital stay was:  Gardner Candle, [facility]  Why was Coumadin prescribed for you? Coumadin was prescribed for you because you have a blood clot or a medical condition that can cause an increased risk of forming blood clots. Blood clots can cause serious health problems by blocking the flow of blood to the heart, lung, or brain. Coumadin can prevent harmful blood clots from forming. As a reminder your indication for Coumadin is:   Blood Clot Prevention After Orthopedic Surgery  What test will check on my response to Coumadin? While on Coumadin (warfarin) you will need to have an INR test regularly to ensure that your dose is keeping you in the desired range. The INR (international normalized ratio) number is calculated from the result of the laboratory test called prothrombin time (PT).  If an INR APPOINTMENT HAS NOT ALREADY BEEN MADE FOR YOU please schedule an appointment to have this lab work done by your health care provider within 7 days. Your INR goal is usually a number between:  2 to 3 or your provider may give you a more narrow range like 2-2.5.  Ask your health care provider during an office visit what your goal INR is.  What  do you need to  know  About  COUMADIN? Take Coumadin (warfarin) exactly as prescribed by your healthcare provider about the same time each  day.  DO NOT stop taking without talking to the doctor who prescribed the medication.  Stopping without other blood clot prevention medication to take the place of Coumadin may increase your risk of developing a new clot or stroke.  Get refills before you run out.  What do you do if you miss a dose? If you miss a dose, take it as soon as you remember on the same day then continue your regularly scheduled regimen the next day.  Do not take two doses of Coumadin at the same time.  Important Safety Information A possible side effect of Coumadin (Warfarin) is an increased risk of bleeding. You should call your healthcare provider right away if you experience any of the following: ? Bleeding from an injury or your nose that does not stop. ? Unusual colored urine (red or dark brown) or unusual colored stools (red or black). ? Unusual bruising for unknown reasons. ? A serious fall or if you hit your head (even if there is no bleeding).  Some foods or medicines interact with Coumadin (warfarin) and might alter your response to warfarin. To help avoid this: ? Eat a balanced diet, maintaining a consistent amount of Vitamin K. ? Notify your provider about major diet changes you plan to make. ? Avoid alcohol or limit your intake to 1 drink for women and 2 drinks for men per day. (1 drink is 5 oz.  wine, 12 oz. beer, or 1.5 oz. liquor.)  Make sure that ANY health care provider who prescribes medication for you knows that you are taking Coumadin (warfarin).  Also make sure the healthcare provider who is monitoring your Coumadin knows when you have started a new medication including herbals and non-prescription products.  Coumadin (Warfarin)  Major Drug Interactions  Increased Warfarin Effect Decreased Warfarin Effect  Alcohol (large quantities) Antibiotics (esp. Septra/Bactrim, Flagyl, Cipro) Amiodarone (Cordarone) Aspirin (ASA) Cimetidine (Tagamet) Megestrol (Megace) NSAIDs (ibuprofen, naproxen,  etc.) Piroxicam (Feldene) Propafenone (Rythmol SR) Propranolol (Inderal) Isoniazid (INH) Posaconazole (Noxafil) Barbiturates (Phenobarbital) Carbamazepine (Tegretol) Chlordiazepoxide (Librium) Cholestyramine (Questran) Griseofulvin Oral Contraceptives Rifampin Sucralfate (Carafate) Vitamin K   Coumadin (Warfarin) Major Herbal Interactions  Increased Warfarin Effect Decreased Warfarin Effect  Garlic Ginseng Ginkgo biloba Coenzyme Q10 Green tea St. Johns wort    Coumadin (Warfarin) FOOD Interactions  Eat a consistent number of servings per week of foods HIGH in Vitamin K (1 serving =  cup)  Collards (cooked, or boiled & drained) Kale (cooked, or boiled & drained) Mustard greens (cooked, or boiled & drained) Parsley *serving size only =  cup Spinach (cooked, or boiled & drained) Swiss chard (cooked, or boiled & drained) Turnip greens (cooked, or boiled & drained)  Eat a consistent number of servings per week of foods MEDIUM-HIGH in Vitamin K (1 serving = 1 cup)  Asparagus (cooked, or boiled & drained) Broccoli (cooked, boiled & drained, or raw & chopped) Brussel sprouts (cooked, or boiled & drained) *serving size only =  cup Lettuce, raw (green leaf, endive, romaine) Spinach, raw Turnip greens, raw & chopped   These websites have more information on Coumadin (warfarin):  http://www.king-russell.com/www.coumadin.com; https://www.hines.net/www.ahrq.gov/consumer/coumadin.htm;

## 2015-10-17 NOTE — Progress Notes (Signed)
   Subjective: 3 Days Post-Op Procedure(s) (LRB): LEFT TOTAL KNEE ARTHROPLASTY (Left)  Pt sitting in chair comfortably Wife state he has been lethargic since surgery Stopped taking pain pills yesterday Had episode of syncope vs stroke yesterday with negative head MRA Evaluated by neurology yesterday Pain in the knee is moderate  Patient reports pain as moderate.  Objective:   VITALS:   Vitals:   10/17/15 0625 10/17/15 0842  BP: 104/61 (!) 107/59  Pulse: 89 90  Resp: 16 18  Temp: 99.2 F (37.3 C) 99.5 F (37.5 C)    Left knee incision healing well Decreased sensation to anterior leg from the knee distally to left foot No rashes or erythema Mildly lethargic this morning Slight facial droop to the left   LABS  Recent Labs  10/15/15 0348 10/16/15 0228 10/17/15 0503  HGB 13.9 12.2* 11.5*  HCT 43.0 38.7* 34.8*  WBC 13.6* 19.0* 13.9*  PLT 219 208 185     Recent Labs  10/14/15 1825 10/15/15 0348  NA  --  134*  K  --  4.5  BUN  --  19  CREATININE 1.44* 1.53*  GLUCOSE  --  174*     Assessment/Plan: 3 Days Post-Op Procedure(s) (LRB): LEFT TOTAL KNEE ARTHROPLASTY (Left) Will continue to monitor his progress MRA of head was negative  Continue PT/OT Improvement of lethargy before consideration for d/c    Alphonsa OverallBrad Xzavien Harada, MPAS, PA-C  10/17/2015, 10:17 AM

## 2015-10-18 DIAGNOSIS — R29818 Other symptoms and signs involving the nervous system: Secondary | ICD-10-CM

## 2015-10-18 DIAGNOSIS — I951 Orthostatic hypotension: Secondary | ICD-10-CM

## 2015-10-18 DIAGNOSIS — I679 Cerebrovascular disease, unspecified: Secondary | ICD-10-CM

## 2015-10-18 DIAGNOSIS — J189 Pneumonia, unspecified organism: Secondary | ICD-10-CM

## 2015-10-18 DIAGNOSIS — J690 Pneumonitis due to inhalation of food and vomit: Secondary | ICD-10-CM

## 2015-10-18 LAB — COMPREHENSIVE METABOLIC PANEL
ALK PHOS: 95 U/L (ref 38–126)
ALT: 29 U/L (ref 17–63)
AST: 68 U/L — ABNORMAL HIGH (ref 15–41)
Albumin: 2.3 g/dL — ABNORMAL LOW (ref 3.5–5.0)
Anion gap: 8 (ref 5–15)
BILIRUBIN TOTAL: 0.7 mg/dL (ref 0.3–1.2)
BUN: 17 mg/dL (ref 6–20)
CALCIUM: 8.9 mg/dL (ref 8.9–10.3)
CO2: 23 mmol/L (ref 22–32)
CREATININE: 1.45 mg/dL — AB (ref 0.61–1.24)
Chloride: 101 mmol/L (ref 101–111)
GFR, EST AFRICAN AMERICAN: 55 mL/min — AB (ref >60)
GFR, EST NON AFRICAN AMERICAN: 48 mL/min — AB (ref >60)
Glucose, Bld: 171 mg/dL — ABNORMAL HIGH (ref 65–99)
Potassium: 3.9 mmol/L (ref 3.5–5.1)
Sodium: 132 mmol/L — ABNORMAL LOW (ref 135–145)
Total Protein: 5.9 g/dL — ABNORMAL LOW (ref 6.5–8.1)

## 2015-10-18 LAB — URINALYSIS, ROUTINE W REFLEX MICROSCOPIC
Bilirubin Urine: NEGATIVE
GLUCOSE, UA: 500 mg/dL — AB
KETONES UR: NEGATIVE mg/dL
Leukocytes, UA: NEGATIVE
Nitrite: NEGATIVE
PH: 7 (ref 5.0–8.0)
Protein, ur: 30 mg/dL — AB
Specific Gravity, Urine: 1.015 (ref 1.005–1.030)

## 2015-10-18 LAB — CBC WITH DIFFERENTIAL/PLATELET
Basophils Absolute: 0 10*3/uL (ref 0.0–0.1)
Basophils Relative: 0 %
Eosinophils Absolute: 0.1 10*3/uL (ref 0.0–0.7)
Eosinophils Relative: 1 %
HEMATOCRIT: 34.5 % — AB (ref 39.0–52.0)
Hemoglobin: 11.2 g/dL — ABNORMAL LOW (ref 13.0–17.0)
LYMPHS PCT: 16 %
Lymphs Abs: 2.2 10*3/uL (ref 0.7–4.0)
MCH: 30 pg (ref 26.0–34.0)
MCHC: 32.5 g/dL (ref 30.0–36.0)
MCV: 92.5 fL (ref 78.0–100.0)
MONO ABS: 1.5 10*3/uL — AB (ref 0.1–1.0)
MONOS PCT: 10 %
NEUTROS ABS: 10.3 10*3/uL — AB (ref 1.7–7.7)
Neutrophils Relative %: 73 %
Platelets: 219 10*3/uL (ref 150–400)
RBC: 3.73 MIL/uL — ABNORMAL LOW (ref 4.22–5.81)
RDW: 13.6 % (ref 11.5–15.5)
WBC: 14.1 10*3/uL — ABNORMAL HIGH (ref 4.0–10.5)

## 2015-10-18 LAB — SODIUM, URINE, RANDOM: Sodium, Ur: 95 mmol/L

## 2015-10-18 LAB — PROTIME-INR
INR: 1.45
PROTHROMBIN TIME: 17.7 s — AB (ref 11.4–15.2)

## 2015-10-18 LAB — GLUCOSE, CAPILLARY
GLUCOSE-CAPILLARY: 193 mg/dL — AB (ref 65–99)
GLUCOSE-CAPILLARY: 199 mg/dL — AB (ref 65–99)
Glucose-Capillary: 183 mg/dL — ABNORMAL HIGH (ref 65–99)
Glucose-Capillary: 221 mg/dL — ABNORMAL HIGH (ref 65–99)

## 2015-10-18 LAB — BLOOD GAS, VENOUS
ACID-BASE DEFICIT: 1.4 mmol/L (ref 0.0–2.0)
BICARBONATE: 22.8 mmol/L (ref 20.0–28.0)
FIO2: 0.21
O2 SAT: 89.7 %
PCO2 VEN: 37.8 mmHg — AB (ref 44.0–60.0)
Patient temperature: 98.6
pH, Ven: 7.397 (ref 7.250–7.430)
pO2, Ven: 58.4 mmHg — ABNORMAL HIGH (ref 32.0–45.0)

## 2015-10-18 LAB — CREATININE, URINE, RANDOM: Creatinine, Urine: 46.67 mg/dL

## 2015-10-18 LAB — URINE MICROSCOPIC-ADD ON: Bacteria, UA: NONE SEEN

## 2015-10-18 MED ORDER — AMOXICILLIN-POT CLAVULANATE 875-125 MG PO TABS
1.0000 | ORAL_TABLET | Freq: Two times a day (BID) | ORAL | Status: DC
  Administered 2015-10-18 – 2015-10-26 (×16): 1 via ORAL
  Filled 2015-10-18 (×16): qty 1

## 2015-10-18 MED ORDER — WARFARIN SODIUM 5 MG PO TABS
10.0000 mg | ORAL_TABLET | Freq: Once | ORAL | Status: AC
  Administered 2015-10-18: 10 mg via ORAL
  Filled 2015-10-18: qty 2

## 2015-10-18 NOTE — Progress Notes (Signed)
Physical Therapy Treatment Patient Details Name: Devon Williams MRN: [id number] DOB: 03/15/1945 Today's Date: 10/18/2015    History of Present Illness Patient admitted for elective TKR on left.  PMH significant for DM, chronic kidney disease    PT Comments    Patient continues to require +2 assist for mobility and demonstrated cognitive impairments limiting mobility. Current plan remains appropriate.   Follow Up Recommendations  SNF     Equipment Recommendations  Rolling walker with 5" wheels    Recommendations for Other Services       Precautions / Restrictions Precautions Precautions: Knee;Fall Precaution Booklet Issued: No Required Braces or Orthoses: Knee Immobilizer - Left Knee Immobilizer - Left: Discontinue once straight leg raise with < 10 degree lag Restrictions Weight Bearing Restrictions: Yes LLE Weight Bearing: Weight bearing as tolerated    Mobility  Bed Mobility Overal bed mobility: Needs Assistance Bed Mobility: Supine to Sit Rolling: Mod assist;+2 for physical assistance Sidelying to sit: Max assist;+2 for physical assistance Supine to sit: Mod assist;+2 for physical assistance;HOB elevated;Max assist     General bed mobility comments: cues for sequencing/technique; mod assist to bring bilat LE to EOB, max A to elevate trunk into sitting, and mod A to scoot hips toward EOB with use of bedpad to scoot and elevate trunk; pt with limited ability to pull with UE due to cervical pain  Transfers Overall transfer level: Needs assistance Equipment used: Rolling walker (2 wheeled) Transfers: Sit to/from Stand Sit to Stand: Mod assist;Max assist;+2 physical assistance         General transfer comment: cues for safe hand placement and technique; mod A +2 first trial from EOB and max A from [facility] and to descend safely to recliner last trial  Ambulation/Gait Ambulation/Gait assistance: Min assist;+2 physical assistance Ambulation Distance (Feet):  (10,  4) Assistive device: Rolling walker (2 wheeled) Gait Pattern/deviations: Step-to pattern;Decreased stance time - left;Decreased step length - right;Decreased weight shift to left;Antalgic;Trunk flexed Gait velocity: decreased   General Gait Details: max cues for sequencing, posture, and increased weight bear L LE   Stairs            Wheelchair Mobility    Modified Rankin (Stroke Patients Only)       Balance Overall balance assessment: Needs assistance Sitting-balance support: Feet supported;Single extremity supported Sitting balance-Leahy Scale: Poor     Standing balance support: Bilateral upper extremity supported;During functional activity Standing balance-Leahy Scale: Poor                      Cognition Arousal/Alertness: Lethargic Behavior During Therapy: Flat affect Overall Cognitive Status: Impaired/Different from baseline Area of Impairment: Attention;Memory;Following commands;Safety/judgement;Awareness;Problem solving   Current Attention Level: Selective Memory: Decreased short-term memory Following Commands: Follows one step commands with increased time Safety/Judgement: Decreased awareness of safety;Decreased awareness of deficits Awareness: Intellectual Problem Solving: Slow processing;Decreased initiation;Difficulty sequencing;Requires verbal cues;Requires tactile cues General Comments: pt with difficulty performing therex due to difficulty following commands and staying alert to attend to tasks    Exercises Total Joint Exercises Goniometric ROM: AAROM ~5-45    General Comments General comments (skin integrity, edema, etc.): attempted therex but pt confused and unable to follow commands; discussed performing knee flexion with pt's wife and encouraged to work on therex throughout today      Pertinent Vitals/Pain Pain Assessment: 0-10 Pain Score: 4  Pain Location: L knee Pain Descriptors / Indicators: Aching;Grimacing;Guarding;Sore Pain  Intervention(s): Limited activity within patient's tolerance;Monitored during session;Repositioned;Ice applied  Home Living Family/patient expects to be discharged to:: Skilled nursing facility Living Arrangements: Spouse/significant other Available Help at Discharge: Family;Available 24 hours/day Type of Home: House Home Access: Stairs to enter Entrance Stairs-Rails: Right Home Layout: Two level;Able to live on main level with bedroom/bathroom Home Equipment: Gilmer MorCane - single point;Walker - 2 wheels      Prior Function Level of Independence: Independent      Comments: occasionally used cane   PT Goals (current goals can now be found in the care plan section) Acute Rehab PT Goals Patient Stated Goal: Per wife for pt to fully recover. Progress towards PT goals: Progressing toward goals    Frequency    7X/week      PT Plan Current plan remains appropriate    Co-evaluation PT/OT/SLP Co-Evaluation/Treatment: Yes Reason for Co-Treatment: For patient/therapist safety PT goals addressed during session: Mobility/safety with mobility;Proper use of DME;Strengthening/ROM OT goals addressed during session: ADL's and self-care     End of Session Equipment Utilized During Treatment: Gait belt;Left knee immobilizer Activity Tolerance: Patient limited by lethargy Patient left: in chair;with call bell/phone within reach;with chair alarm set;with family/visitor present     Time: [id number] PT Time Calculation (min) (ACUTE ONLY): 52 min  Charges:  $Gait Training: 8-22 mins $Therapeutic Activity: 8-22 mins                    G Codes:      Derek MoundKellyn R Khalif Stender Dennard Vezina, PTA Pager: [phone]   10/18/2015, 10:55 AM

## 2015-10-18 NOTE — Evaluation (Signed)
Occupational Therapy Evaluation Patient Details Name: Devon Williams MRN: [id number] DOB: 1945-04-08 Today's Date: 10/18/2015    History of Present Illness Patient admitted for elective TKR on left.  PMH significant for DM, chronic kidney disease   Clinical Impression   PTA, pt was independent with all basic ADLs. Pt underwent the above and currently requires max assist +2 with transfers and functional mobility as well as with all lower body ADLs. Pt plans to D/C to SNF for rehabilitation. Pt remains confused with with decreased sustained attention to task. Began education concerning safe performance of ADL tasks and pt and family require reinforcement. Will continue to follow acutely. Recommend D/C to SNF.    Follow Up Recommendations  SNF    Equipment Recommendations   (Defer to subsequent venue)    Recommendations for Other Services        Precautions / Restrictions Precautions Precautions: Knee;Fall Precaution Booklet Issued: No Required Braces or Orthoses: Knee Immobilizer - Left Knee Immobilizer - Left: Discontinue once straight leg raise with < 10 degree lag Restrictions Weight Bearing Restrictions: Yes LLE Weight Bearing: Weight bearing as tolerated      Mobility Bed Mobility Overal bed mobility: Needs Assistance;+2 for physical assistance Bed Mobility: Supine to Sit Rolling: Mod assist;+2 for physical assistance Sidelying to sit: Max assist;+2 for physical assistance Supine to sit: Mod assist;+2 for physical assistance;HOB elevated;Max assist       Transfers Overall transfer level: Needs assistance Equipment used: Rolling walker (2 wheeled) Transfers: Sit to/from Stand Sit to Stand: Max assist;+2 physical assistance         General transfer comment: Required VC's for safe hand placement.    Balance Overall balance assessment: Needs assistance Sitting-balance support: Feet supported;Single extremity supported Sitting balance-Leahy Scale: Poor      Standing balance support: Bilateral upper extremity supported;During functional activity Standing balance-Leahy Scale: Poor                              ADL Overall ADL's : Needs assistance/impaired Eating/Feeding: Minimal assistance;Bed level;Cueing for sequencing Eating/Feeding Details (indicate cue type and reason): Min assist required for attention to task Grooming: Minimal assistance;Cueing for safety;Cueing for sequencing;Bed level   Upper Body Bathing: Maximal assistance;Sitting Upper Body Bathing Details (indicate cue type and reason): Unable to maintain balance during dynamic activity Lower Body Bathing: Maximal assistance;Sit to/from stand;+2 for physical assistance   Upper Body Dressing : Maximal assistance;Sitting Upper Body Dressing Details (indicate cue type and reason): Max assist to maintain balance during dynamic sitting. Lower Body Dressing: Maximal assistance;Sit to/from stand;+2 for physical assistance   Toilet Transfer: Maximal assistance;+2 for physical assistance   Toileting- Clothing Manipulation and Hygiene: Maximal assistance;Sit to/from stand;+2 for physical assistance     Tub/Shower Transfer Details (indicate cue type and reason): Unable to assess at this time  Functional mobility during ADLs: Maximal assistance;+2 for physical assistance       Vision     Perception     Praxis      Pertinent Vitals/Pain Pain Assessment: 0-10 Pain Score: 4  Pain Location: L knee Pain Descriptors / Indicators: Aching;Grimacing;Sore;Discomfort Pain Intervention(s): Monitored during session;Limited activity within patient's tolerance;Repositioned     Hand Dominance Right   Extremity/Trunk Assessment Upper Extremity Assessment Upper Extremity Assessment: Generalized weakness   Lower Extremity Assessment Lower Extremity Assessment: LLE deficits/detail LLE Deficits / Details: Decreased strength and ROM post-operatively.        Communication  Communication Communication:  HOH   Cognition Arousal/Alertness: Lethargic Behavior During Therapy: Flat affect Overall Cognitive Status: Impaired/Different from baseline Area of Impairment: Attention;Memory;Following commands;Safety/judgement;Awareness;Problem solving   Current Attention Level: Selective Memory: Decreased short-term memory Following Commands: Follows one step commands with increased time Safety/Judgement: Decreased awareness of safety;Decreased awareness of deficits Awareness: Emergent Problem Solving: Slow processing;Decreased initiation;Difficulty sequencing;Requires verbal cues;Requires tactile cues General Comments: Pt demonstrated difficulty following greater than 1-step directions with ambulation and ADL tasks. Pt struggling to stay awake and attending to tasks.   General Comments       Exercises       Shoulder Instructions      Home Living Family/patient expects to be discharged to:: Skilled nursing facility Living Arrangements: Spouse/significant other Available Help at Discharge: Family;Available 24 hours/day Type of Home: House Home Access: Stairs to enter Entergy CorporationEntrance Stairs-Number of Steps: 2 Entrance Stairs-Rails: Right Home Layout: Two level;Able to live on main level with bedroom/bathroom     Bathroom Shower/Tub: Tub/shower unit Shower/tub characteristics: Engineer, building servicesCurtain Bathroom Toilet: Standard     Home Equipment: Cane - single point;Walker - 2 wheels          Prior Functioning/Environment Level of Independence: Independent        Comments: occasionally used cane        OT Problem List: Decreased strength;Decreased range of motion;Decreased activity tolerance;Impaired balance (sitting and/or standing);Decreased safety awareness;Decreased knowledge of use of DME or AE;Decreased knowledge of precautions;Pain   OT Treatment/Interventions: Self-care/ADL training;Therapeutic activities;Therapeutic exercise;DME and/or AE instruction;Balance  training    OT Goals(Current goals can be found in the care plan section) Acute Rehab OT Goals Patient Stated Goal: Per wife for pt to fully recover. OT Goal Formulation: With patient/family Time For Goal Achievement: 11/01/15 Potential to Achieve Goals: Good ADL Goals Pt Will Perform Grooming: with supervision;with set-up;bed level Pt Will Perform Upper Body Bathing: with min assist;sitting Pt Will Perform Lower Body Bathing: with mod assist;sit to/from stand Pt Will Perform Upper Body Dressing: sitting;with min assist Pt Will Perform Lower Body Dressing: with mod assist;sit to/from stand Pt Will Transfer to Toilet: bedside commode;with mod assist Pt Will Perform Toileting - Clothing Manipulation and hygiene: with mod assist;sit to/from stand  OT Frequency: Min 2X/week   Barriers to D/C:            Co-evaluation PT/OT/SLP Co-Evaluation/Treatment: Yes Reason for Co-Treatment: For patient/therapist safety PT goals addressed during session: Mobility/safety with mobility;Proper use of DME;Strengthening/ROM OT goals addressed during session: ADL's and self-care      End of Session Equipment Utilized During Treatment: Gait belt;Rolling walker CPM Left Knee CPM Left Knee: Off  Activity Tolerance: Patient limited by fatigue Patient left: in chair;with call bell/phone within reach;with family/visitor present   Time: 0928-1020 OT Time Calculation (min): 52 min Charges:  OT General Charges $OT Visit: 1 Procedure OT Evaluation $OT Eval Moderate Complexity: 1 Procedure G-CodesDoristine Section:    Robynn Marcel A Nikaela Coyne, OTR/L [phone] 10/18/2015, 1:50 PM

## 2015-10-18 NOTE — Progress Notes (Signed)
Neurology Progress Note  Subjective: He had some new confusion overnight and was seen by the hospitalist who noted low grade fevers. CXR and UA were ordered. The CXR shows possible L basilar pneumonia, UA still pending. The patient endorses mild confusion but feels a little better this morning. He has not had any further syncope but still feels lightheaded when upright. No further slurred speech or focal weakness. He is eager to go home.   Current Meds:   Current Facility-Administered Medications:  .  0.9 %  sodium chloride infusion, , Intravenous, Continuous, Therisa DoyneAnastassia Doutova, MD, Stopped at 10/18/15 0914 .  acetaminophen (TYLENOL) tablet 650 mg, 650 mg, Oral, Q6H PRN **OR** acetaminophen (TYLENOL) suppository 650 mg, 650 mg, Rectal, Q6H PRN, Beverely LowSteve Norris, MD .  acetaminophen (TYLENOL) tablet 1,000 mg, 1,000 mg, Oral, BID, Beverely LowSteve Norris, MD, 1,000 mg at 10/18/15 0901 .  aspirin EC tablet 81 mg, 81 mg, Oral, QHS, Beverely LowSteve Norris, MD, 81 mg at 10/17/15 2253 .  atorvastatin (LIPITOR) tablet 80 mg, 80 mg, Oral, Daily, Beverely LowSteve Norris, MD, 80 mg at 10/18/15 0901 .  Besifloxacin HCl 0.6 % SUSP 1 drop, 1 drop, Right Eye, TID, Beverely LowSteve Norris, MD, 1 drop at 10/18/15 0914 .  bisacodyl (DULCOLAX) suppository 10 mg, 10 mg, Rectal, Daily PRN, Beverely LowSteve Norris, MD .  Bromfenac Sodium 0.07 % SOLN 1 drop, 1 drop, Ophthalmic, QHS, Beverely LowSteve Norris, MD, 1 drop at 10/16/15 2128 .  carvedilol (COREG) tablet 6.25 mg, 6.25 mg, Oral, BID WC, Little IshikawaErin E Odriscoll, NP, 6.25 mg at 10/18/15 0908 .  cholecalciferol (VITAMIN D) tablet 2,000 Units, 2,000 Units, Oral, Daily, Beverely LowSteve Norris, MD, 2,000 Units at 10/18/15 0902 .  coumadin book, , Does not apply, Once, Gerilyn Nestlehuy D Dang, RPH .  Difluprednate 0.05 % EMUL 1 drop, 1 drop, Left Eye, 2 times per day, Beverely LowSteve Norris, MD, 1 drop at 10/18/15 0914 .  docusate sodium (COLACE) capsule 100 mg, 100 mg, Oral, BID, Beverely LowSteve Norris, MD, 100 mg at 10/18/15 0901 .  enoxaparin (LOVENOX) injection 30 mg, 30 mg,  Subcutaneous, Q12H, Beverely LowSteve Norris, MD, 30 mg at 10/18/15 0900 .  ferrous sulfate tablet 325 mg, 325 mg, Oral, TID PC, Beverely LowSteve Norris, MD, 325 mg at 10/18/15 0901 .  insulin aspart (novoLOG) injection 0-20 Units, 0-20 Units, Subcutaneous, TID WC, Beverely LowSteve Norris, MD, 4 Units at 10/18/15 0900 .  insulin aspart (novoLOG) injection 0-5 Units, 0-5 Units, Subcutaneous, QHS, Beverely LowSteve Norris, MD .  insulin aspart (novoLOG) injection 6 Units, 6 Units, Subcutaneous, TID WC, Beverely LowSteve Norris, MD, 6 Units at 10/18/15 0900 .  lactated ringers infusion, , Intravenous, Continuous, Achille RichAdam Hodierne, MD, Last Rate: 10 mL/hr at 10/14/15 1157 .  loratadine (CLARITIN) tablet 10 mg, 10 mg, Oral, Daily, Beverely LowSteve Norris, MD, 10 mg at 10/18/15 0902 .  lurasidone (LATUDA) tablet 40 mg, 40 mg, Oral, QHS, Beverely LowSteve Norris, MD, 40 mg at 10/18/15 0000 .  menthol-cetylpyridinium (CEPACOL) lozenge 3 mg, 1 lozenge, Oral, PRN **OR** phenol (CHLORASEPTIC) mouth spray 1 spray, 1 spray, Mouth/Throat, PRN, Beverely LowSteve Norris, MD .  metoCLOPramide (REGLAN) tablet 5-10 mg, 5-10 mg, Oral, Q8H PRN **OR** metoCLOPramide (REGLAN) injection 5-10 mg, 5-10 mg, Intravenous, Q8H PRN, Beverely LowSteve Norris, MD .  omega-3 acid ethyl esters (LOVAZA) capsule 2 g, 2 g, Oral, BID, Beverely LowSteve Norris, MD, 2 g at 10/18/15 0901 .  ondansetron (ZOFRAN) tablet 4 mg, 4 mg, Oral, Q6H PRN **OR** ondansetron (ZOFRAN) injection 4 mg, 4 mg, Intravenous, Q6H PRN, Beverely LowSteve Norris, MD .  oxyCODONE-acetaminophen (PERCOCET/ROXICET) 5-325 MG  per tablet 1 tablet, 1 tablet, Oral, Q4H PRN, Beverely Low, MD, 1 tablet at 10/16/15 [phone] .  pantoprazole (PROTONIX) EC tablet 80 mg, 80 mg, Oral, Daily, Beverely Low, MD, 80 mg at 10/18/15 0901 .  polyethylene glycol (MIRALAX / GLYCOLAX) packet 17 g, 17 g, Oral, Daily PRN, Beverely Low, MD .  tranexamic acid (CYKLOKAPRON) 2,000 mg in sodium chloride 0.9 % 50 mL Topical Application, 2,000 mg, Topical, Once, Beverely Low, MD .  venlafaxine XR [facility]) 24 hr capsule 150 mg,  150 mg, Oral, BID, Beverely Low, MD, 150 mg at 10/18/15 0902 .  Warfarin - Pharmacist Dosing Inpatient, , Does not apply, q1800, Gerilyn Nestle, RPH, 7.5 each at 10/16/15 1800  Objective:  Temp:  [97.7 F (36.5 C)-100 F (37.8 C)] 98.6 F (37 C) (09/26 0422) Pulse Rate:  [82-98] 86 (09/26 0908) Resp:  [16-20] 20 (09/26 0906) BP: (82-152)/(40-74) 125/64 (09/26 0908) SpO2:  [94 %-100 %] 97 % (09/26 0906)  General: WDWN Caucasian man resting in bed in NAD. Alert, oriented x4. Speech is clear without dysarthria. Affect is bright. Comportment is normal.  HEENT: Neck is supple without lymphadenopathy. Mucous membranes are moist and the oropharynx is clear. Sclerae are anicteric. There is no conjunctival injection.  CV: Regular, no murmur. Carotid pulses are 2+ and symmetric with no bruits.  Lungs: CTAB  Neuro: MS: As noted above. No aphasia.  CN: Pupils are equal and reactive from 3-->2 mm bilaterally. EOMI with some breakup of smooth pursuits, no nystagmus. Facial sensation is intact to light touch. Face is symmetric at rest with normal strength and mobility. Hearing is decreased to conversational voice. Voice is normal in tone and quality. Palate elevates symmetrically. Uvula is midline. Bilateral SCM and trapezii are 5/5. Tongue is midline with normal bulk and mobility.  Motor: Normal bulk, tone, and strength throughout; LLE not fully examined due to recent TKA. No pronator drift. No tremor or other abnormal movements are observed.  Sensation: Intact to light touch.  DTRs: 2+, symmetric; L knee deferred.  Coordination: Finger-to-nose without dysmetria bilaterally.    Labs: Lab Results  Component Value Date   WBC 14.1 (H) 10/18/2015   HGB 11.2 (L) 10/18/2015   HCT 34.5 (L) 10/18/2015   PLT 219 10/18/2015   GLUCOSE 171 (H) 10/18/2015   CHOL 131 10/17/2015   TRIG 113 10/17/2015   HDL 44 10/17/2015   LDLCALC 64 10/17/2015   ALT 29 10/18/2015   AST 68 (H) 10/18/2015   NA 132 (L)  10/18/2015   K 3.9 10/18/2015   CL 101 10/18/2015   CREATININE 1.45 (H) 10/18/2015   BUN 17 10/18/2015   CO2 23 10/18/2015   INR 1.45 10/18/2015   HGBA1C 7.2 (H) 10/17/2015   CBC Latest Ref Rng & Units 10/18/2015 10/17/2015 10/16/2015  WBC 4.0 - 10.5 K/uL 14.1(H) 13.9(H) 19.0(H)  Hemoglobin 13.0 - 17.0 g/dL 11.2(L) 11.5(L) 12.2(L)  Hematocrit 39.0 - 52.0 % 34.5(L) 34.8(L) 38.7(L)  Platelets 150 - 400 K/uL 219 185 208    Lab Results  Component Value Date   HGBA1C 7.2 (H) 10/17/2015   Lab Results  Component Value Date   ALT 29 10/18/2015   AST 68 (H) 10/18/2015   ALKPHOS 95 10/18/2015   BILITOT 0.7 10/18/2015    Radiology:  I have personally and independently reviewed the MRI of the brain without contrast from 10/16/15. This shows mild chronic small vessel disease in the bihemispheric white matter and pons. No acute ischemic is present.  I have personally and independently reviewed the MRA of the head from 10/16/15. This is unremarkable.   Other diagnostic studies:  Carotid Dopplers pending  A/P:   1. Syncope: History as presented is most consistent with syncope, likely orthostatic given lightheadedness with upright positions. Ensure adequate hydration. Avoid sudden position changes and allow acclimation after sitting/standing before moving.   2. Transient neurologic symptoms: These were likely reflective of transient cerebral hypoperfusion in the setting of orthostatic syncope. Although transient deficits are uncommon in this setting, they can occur. MRI of the brain showed no acute ischemia. MRA of the head showed no intracranial stenoses. Carotid stenosis must be excluded and Dopplers are still pending at this time. I will follow up on these results.   3. Encephalopathy: He had acute confusion overnight. This is most likely multifactorial in etiology with potential contributions from possible PNA, dehydration. Continue to optimize metabolic status as you are. Continue to treat  any underlying infection. Minimize the use of opiates, benzos or any medication with strong anticholinergic properties as much as possible. Optimize sleep-wake cycles as much as you can by keeping the room bright with activity during the day and dark and quiet at night. For agitation, recommend low-dose haloperidol or an atypical antipsychotic.   4. Cerebrovascular disease: MRI shows mild chronic small vessel ischemic disease. His known risk factors for cerebrovascular disease include HTN, DM, and age. LDL 64, at goal. His hemoglobin a1c is elevated at 7.2 and he would benefit from tighter glycemic control longterm. Ensure tight BP control. Continue aspirin, atorvastatin.   This was discussed with the patient and his wife and they are in agreement with the plan as stated. They were given the opportunity to ask questions and these were addressed to their satisfaction. I will follow up on the CUS but otherwise have no further recommendations at this time and will sign off. Please call if new issues arise.   Rhona Leavens, MD [facility]

## 2015-10-18 NOTE — Progress Notes (Signed)
ANTICOAGULATION CONSULT NOTE  Pharmacy Consult:  Coumadin Indication:  VTE prophylaxis  Patient Measurements: Weight: 277 lb (125.6 kg)  Labs:  Recent Labs  10/16/15 0228 10/17/15 0503 10/17/15 2209 10/18/15 0304  HGB 12.2* 11.5*  --  11.2*  HCT 38.7* 34.8*  --  34.5*  PLT 208 185  --  219  LABPROT 16.1* 14.8  --  17.7*  INR 1.28 1.16  --  1.45  CREATININE  --   --   --  1.45*  TROPONINI  --   --  <0.03  --     Estimated Creatinine Clearance: 64.9 mL/min (by C-G formula based on SCr of 1.45 mg/dL (H)).   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 INR slow to rise, remains subtherapeutic at 1.45.   Hgb down from 13.9 to 11.5, PLTC wnl. No s/sx of bleeding noted.  Goal of Therapy:  INR 2-3   Plan:  - Coumadin 10 mg PO today - Lovenox until INR >/= 1.8 - Daily PT / INR  Loura BackJennifer Stella, PharmD, BCPS Clinical Pharmacist Main pharmacy - [phone] 10/18/2015 1:24 PM

## 2015-10-18 NOTE — Progress Notes (Signed)
Orthopedics Progress Note  Subjective: Patient remains very lethargic and not as awake or alert as his baseline   Objective:  Vitals:   10/18/15 0104 10/18/15 0422  BP: 129/61 123/68  Pulse: 82 89  Resp: 16 16  Temp: 97.8 F (36.6 C) 98.6 F (37 C)    General: Awake and alert  Musculoskeletal: left leg with no cords and neg Homan's, incision benign Neurovascularly intact  Lab Results  Component Value Date   WBC 14.1 (H) 10/18/2015   HGB 11.2 (L) 10/18/2015   HCT 34.5 (L) 10/18/2015   MCV 92.5 10/18/2015   PLT 219 10/18/2015       Component Value Date/Time   NA 132 (L) 10/18/2015 0304   K 3.9 10/18/2015 0304   CL 101 10/18/2015 0304   CO2 23 10/18/2015 0304   GLUCOSE 171 (H) 10/18/2015 0304   BUN 17 10/18/2015 0304   CREATININE 1.45 (H) 10/18/2015 0304   CALCIUM 8.9 10/18/2015 0304   GFRNONAA 48 (L) 10/18/2015 0304   GFRAA 55 (L) 10/18/2015 0304    Lab Results  Component Value Date   INR 1.45 10/18/2015   INR 1.16 10/17/2015   INR 1.28 10/16/2015    Assessment/Plan: POD #4 s/p Procedure(s): LEFT TOTAL KNEE ARTHROPLASTY Continue medical work-up for AMS, appreciate medicine and neurology consult Question of left base pneumonia - defer abx to internal medicine  Almedia BallsSteven R. Ranell PatrickNorris, MD 10/18/2015 7:31 AM

## 2015-10-18 NOTE — NC FL2 (Signed)
[place] MEDICAID FL2 LEVEL OF CARE SCREENING TOOL     IDENTIFICATION  Patient Name: Devon Williams Birthdate: 1945/11/30 Sex: male Admission Date (Current Location): 10/14/2015  [facility] and [place] Number:   Ignacia Palma)   Facility and Address:  [facility], [address], [place], [place] [address]      Provider Number: [id number]  Attending Physician Name and Address:  Beverely Low, MD  Relative Name and Phone Number:       Current Level of Care: Hospital Recommended Level of Care: Skilled Nursing Facility Prior Approval Number:    Date Approved/Denied:   PASRR Number: [id number]  Discharge Plan: SNF    Current Diagnoses: Patient Active Problem List   Diagnosis Date Noted  . Essential hypertension 10/17/2015  . Acute encephalopathy 10/17/2015  . DM (diabetes mellitus), type 2 with renal complications (HCC) 10/17/2015  . CKD (chronic kidney disease), stage III 10/17/2015  . H/O total knee replacement 10/14/2015    Orientation RESPIRATION BLADDER Height & Weight     Self, Time, Situation, Place  Normal Continent Weight: 277 lb (125.6 kg) Height:     BEHAVIORAL SYMPTOMS/MOOD NEUROLOGICAL BOWEL NUTRITION STATUS   (None)  (None) Continent Diet (Renal/carb modified with fluid restriction)  AMBULATORY STATUS COMMUNICATION OF NEEDS Skin   Extensive Assist Verbally Surgical wounds                       Personal Care Assistance Level of Assistance  Bathing, Feeding, Dressing Bathing Assistance: Limited assistance Feeding assistance: Limited assistance Dressing Assistance: Limited assistance     Functional Limitations Info  Sight, Hearing, Speech Sight Info: Adequate Hearing Info: Adequate Speech Info: Adequate    SPECIAL CARE FACTORS FREQUENCY  PT (By licensed PT), OT (By licensed OT), Diabetic urine testing, Blood pressure     PT Frequency: 5 x week OT Frequency: 5 x week            Contractures Contractures Info: Not  present    Additional Factors Info  Code Status, Allergies Code Status Info: Full Allergies Info: Naburnetone           Current Medications (10/18/2015):  This is the current hospital active medication list Current Facility-Administered Medications  Medication Dose Route Frequency Provider Last Rate Last Dose  . acetaminophen (TYLENOL) tablet 650 mg  650 mg Oral Q6H PRN Beverely Low, MD       Or  . acetaminophen (TYLENOL) suppository 650 mg  650 mg Rectal Q6H PRN Beverely Low, MD      . acetaminophen (TYLENOL) tablet 1,000 mg  1,000 mg Oral BID Beverely Low, MD   1,000 mg at 10/18/15 0901  . amoxicillin-clavulanate (AUGMENTIN) 875-125 MG per tablet 1 tablet  1 tablet Oral Q12H Osvaldo Shipper, MD      . aspirin EC tablet 81 mg  81 mg Oral QHS Beverely Low, MD   81 mg at 10/17/15 2253  . atorvastatin (LIPITOR) tablet 80 mg  80 mg Oral Daily Beverely Low, MD   80 mg at 10/18/15 0901  . Besifloxacin HCl 0.6 % SUSP 1 drop  1 drop Right Eye TID Beverely Low, MD   1 drop at 10/18/15 0914  . bisacodyl (DULCOLAX) suppository 10 mg  10 mg Rectal Daily PRN Beverely Low, MD      . Bromfenac Sodium 0.07 % SOLN 1 drop  1 drop Ophthalmic QHS Beverely Low, MD   1 drop at 10/16/15 2128  . carvedilol (COREG)  tablet 6.25 mg  6.25 mg Oral BID WC Little IshikawaErin E Belt, NP   6.25 mg at 10/18/15 0908  . cholecalciferol (VITAMIN D) tablet 2,000 Units  2,000 Units Oral Daily Beverely LowSteve Norris, MD   2,000 Units at 10/18/15 0902  . coumadin book   Does not apply Once Gerilyn Nestlehuy D Dang, [facility]CRPH      . Difluprednate 0.05 % EMUL 1 drop  1 drop Left Eye 2 times per day Beverely LowSteve Norris, MD   1 drop at 10/18/15 0914  . docusate sodium (COLACE) capsule 100 mg  100 mg Oral BID Beverely LowSteve Norris, MD   100 mg at 10/18/15 0901  . enoxaparin (LOVENOX) injection 30 mg  30 mg Subcutaneous Q12H Beverely LowSteve Norris, MD   30 mg at 10/18/15 0900  . ferrous sulfate tablet 325 mg  325 mg Oral TID PC Beverely LowSteve Norris, MD   325 mg at 10/18/15 0901  . insulin aspart (novoLOG)  injection 0-20 Units  0-20 Units Subcutaneous TID WC Beverely LowSteve Norris, MD   4 Units at 10/18/15 0900  . insulin aspart (novoLOG) injection 0-5 Units  0-5 Units Subcutaneous QHS Beverely LowSteve Norris, MD      . insulin aspart (novoLOG) injection 6 Units  6 Units Subcutaneous TID WC Beverely LowSteve Norris, MD   6 Units at 10/18/15 0900  . lactated ringers infusion   Intravenous Continuous Achille RichAdam Hodierne, MD 10 mL/hr at 10/14/15 1157    . loratadine (CLARITIN) tablet 10 mg  10 mg Oral Daily Beverely LowSteve Norris, MD   10 mg at 10/18/15 0902  . lurasidone (LATUDA) tablet 40 mg  40 mg Oral QHS Beverely LowSteve Norris, MD   40 mg at 10/18/15 0000  . menthol-cetylpyridinium (CEPACOL) lozenge 3 mg  1 lozenge Oral PRN Beverely LowSteve Norris, MD       Or  . phenol [facility](CHLORASEPTIC) mouth spray 1 spray  1 spray Mouth/Throat PRN Beverely LowSteve Norris, MD      . metoCLOPramide (REGLAN) tablet 5-10 mg  5-10 mg Oral Q8H PRN Beverely LowSteve Norris, MD       Or  . metoCLOPramide (REGLAN) injection 5-10 mg  5-10 mg Intravenous Q8H PRN Beverely LowSteve Norris, MD      . omega-3 acid ethyl esters (LOVAZA) capsule 2 g  2 g Oral BID Beverely LowSteve Norris, MD   2 g at 10/18/15 0901  . ondansetron (ZOFRAN) tablet 4 mg  4 mg Oral Q6H PRN Beverely LowSteve Norris, MD       Or  . ondansetron [facility](ZOFRAN) injection 4 mg  4 mg Intravenous Q6H PRN Beverely LowSteve Norris, MD      . oxyCODONE-acetaminophen (PERCOCET/ROXICET) 5-325 MG per tablet 1 tablet  1 tablet Oral Q4H PRN Beverely LowSteve Norris, MD   1 tablet at 10/16/15 [phone]  . pantoprazole (PROTONIX) EC tablet 80 mg  80 mg Oral Daily Beverely LowSteve Norris, MD   80 mg at 10/18/15 0901  . polyethylene glycol (MIRALAX / GLYCOLAX) packet 17 g  17 g Oral Daily PRN Beverely LowSteve Norris, MD      . tranexamic acid (CYKLOKAPRON) 2,000 mg in sodium chloride 0.9 % 50 mL Topical Application  2,000 mg Topical Once Beverely LowSteve Norris, MD      . venlafaxine XR (EFFEXOR-XR) 24 hr capsule 150 mg  150 mg Oral BID Beverely LowSteve Norris, MD   150 mg at 10/18/15 0902  . Warfarin - Pharmacist Dosing Inpatient   Does not apply q1800 Gerilyn Nestlehuy D Dang, RPH    7.5 each at 10/16/15 1800     Discharge Medications: Please see discharge  summary for a list of discharge medications.  Relevant Imaging Results:  Relevant Lab Results:   Additional Information SS#: [id number]  Margarito Liner, LCSW

## 2015-10-18 NOTE — Clinical Social Work Note (Addendum)
CSW paged [facility]. In the past this has been the best way to reach her. Will find out what information needs to be [facility].  Charlynn CourtSarah Lataya Varnell, CSW [phone]  3:22 pm Information [facility].  Charlynn CourtSarah Atleigh Gruen, CSW [phone]

## 2015-10-18 NOTE — Progress Notes (Addendum)
[facility] PROGRESS NOTE  Devon Williams ZOX:[id number] DOB: [birth year] DOA: 10/14/2015  PCP: Elijio Miles, MD  Brief History/Interval Summary: 70 year old Caucasian male with a past medical history of hyperlipidemia, HTN, carotid artery disease, GERD, depression, diabetes, CKD III followed by nephrology at [facility]. Patient underwent left knee arthroplasty on 9/22. Hospital course was complicated by transient loss of consciousness with perhaps some right-sided weakness. Code stroke was called. Patient was seen by neurology. Patient underwent MRI which did not reveal any stroke. His symptoms were thought to be due to orthostatic hypotension. Patient was monitored on the floor. Medicine was consulted last night due to fever and persistent confusion.  Reason for Visit: Acute encephalopathy and pneumonia  Antibiotics: Augmentin initiated 9/26  Subjective/Interval History: Patient mildly distracted. He states that his pain is reasonably well controlled. He has had a cough with yellowish expectoration. His wife is at the bedside. She tells me that he is still confused at times, although his condition has improved over the last 2 days.  ROS: Denies any nausea, vomiting.  Objective:  Vital Signs  Vitals:   10/18/15 0104 10/18/15 0422 10/18/15 0906 10/18/15 0908  BP: 129/61 123/68 125/64 125/64  Pulse: 82 89 86 86  Resp: 16 16 20    Temp: 97.8 F (36.6 C) 98.6 F (37 C)    TempSrc: Oral Oral    SpO2: 94% 95% 97%   Weight:        Intake/Output Summary (Last 24 hours) at 10/18/15 1237 Last data filed at 10/18/15 0500  Gross per 24 hour  Intake          1395.83 ml  Output              320 ml  Net          1075.83 ml   Filed Weights   10/14/15 1147  Weight: 125.6 kg (277 lb)    General appearance: alert, cooperative, appears stated age and no distress Resp: Diminished entry at the bases. No definite crackles or wheezing. Cardio: regular rate and rhythm, S1, S2 normal, no  murmur, click, rub or gallop GI: soft, non-tender; bowel sounds normal; no masses,  no organomegaly Extremities: Left lower extremity in an immobilizer. Neurologic: Awake and alert. Oriented to person, place and year or month. He did get the date incorrect. No cranial nerve deficits. No motor strength deficits.  Lab Results:  Data Reviewed: I have personally reviewed following labs and imaging studies  CBC:  Recent Labs Lab 10/14/15 1825 10/15/15 0348 10/16/15 0228 10/17/15 0503 10/18/15 0304  WBC 13.9* 13.6* 19.0* 13.9* 14.1*  NEUTROABS  --   --   --   --  10.3*  HGB 14.0 13.9 12.2* 11.5* 11.2*  HCT 42.7 43.0 38.7* 34.8* 34.5*  MCV 94.3 95.1 94.9 92.1 92.5  PLT 205 219 208 185 219    Basic Metabolic Panel:  Recent Labs Lab 10/14/15 1825 10/15/15 0348 10/18/15 0304  NA  --  134* 132*  K  --  4.5 3.9  CL  --  101 101  CO2  --  24 23  GLUCOSE  --  174* 171*  BUN  --  19 17  CREATININE 1.44* 1.53* 1.45*  CALCIUM  --  9.0 8.9    GFR: Estimated Creatinine Clearance: 64.9 mL/min (by C-G formula based on SCr of 1.45 mg/dL (H)).  Liver Function Tests:  Recent Labs Lab 10/18/15 0304  AST 68*  ALT 29  ALKPHOS 95  BILITOT 0.7  PROT 5.9*  ALBUMIN 2.3*     Recent Labs Lab 10/17/15 2209  AMMONIA 16    Coagulation Profile:  Recent Labs Lab 10/15/15 0348 10/16/15 0228 10/17/15 0503 10/18/15 0304  INR 1.05 1.28 1.16 1.45    Cardiac Enzymes:  Recent Labs Lab 10/17/15 2209  TROPONINI <0.03    HbA1C:  Recent Labs  10/17/15 0503  HGBA1C 7.2*    CBG:  Recent Labs Lab 10/17/15 1154 10/17/15 1640 10/17/15 2256 10/18/15 0620 10/18/15 1114  GLUCAP 188* 120* 145* 193* 199*    Lipid Profile:  Recent Labs  10/17/15 0503  CHOL 131  HDL 44  LDLCALC 64  TRIG 113  CHOLHDL 3.0    Radiology Studies: Dg Chest 2 View  Result Date: 10/18/2015 CLINICAL DATA:  Acute onset of fever and knee pain. Initial encounter. EXAM: CHEST  2 VIEW  COMPARISON:  None. FINDINGS: The lungs are well-aerated. Mild vascular congestion is noted. Mildly increased left-sided interstitial markings and left basilar opacity may reflect mild asymmetric interstitial edema or possibly pneumonia. There is no evidence of pleural effusion or pneumothorax. The heart is normal in size; the mediastinal contour is within normal limits. No acute osseous abnormalities are seen. IMPRESSION: Mild vascular congestion noted. Mildly increased left-sided interstitial markings and left basilar opacity may reflect mild asymmetric interstitial edema or possibly pneumonia. Electronically Signed   By: Roanna RaiderJeffery  Chang M.D.   On: 10/18/2015 03:30   Mr Maxine GlennMra Head Wo Contrast  Result Date: 10/16/2015 CLINICAL DATA:  Brief episode of loss of consciousness followed by a right-sided facial droop, slurred speech, and right arm weakness. Recent total knee arthroplasty. EXAM: MRI HEAD WITHOUT CONTRAST MRA HEAD WITHOUT CONTRAST TECHNIQUE: Multiplanar, multiecho pulse sequences of the brain and surrounding structures were obtained without intravenous contrast. Angiographic images of the head were obtained using MRA technique without contrast. COMPARISON:  Head CT 10/16/2015 FINDINGS: MRI HEAD FINDINGS Brain: There is no evidence of acute infarct, intracranial hemorrhage, mass, midline shift, or extra-axial fluid collection. The ventricles and sulci are within normal limits for age. Scattered T2 hyperintensities in the subcortical and deep cerebral white matter and pons are nonspecific but compatible with mild chronic small vessel ischemic disease. Vascular: Major intracranial vascular flow voids are preserved. Skull and upper cervical spine: Unremarkable bone marrow signal. Sinuses/Orbits: Prior bilateral cataract extraction. Left maxillary sinus mucous retention cyst. Clear mastoid air cells. Other: None. MRA HEAD FINDINGS The visualized distal vertebral arteries are widely patent with the left being  slightly dominant. Right PICA and left AICA origins are patent. Basilar artery is widely patent. SCA origins are patent. There is a patent right posterior communicating artery. PCAs are patent without evidence of major branch occlusion or significant proximal stenosis. Internal carotid arteries are widely patent from skullbase to carotid termini. ACAs and MCAs are patent without evidence of major branch occlusion or significant proximal stenosis. Left ACA is dominant. No intracranial aneurysm is identified. IMPRESSION: 1. No acute intracranial abnormality. 2. Mild chronic small vessel ischemic disease. 3. Negative head MRA. Electronically Signed   By: Sebastian AcheAllen  Grady M.D.   On: 10/16/2015 14:55   Mr Brain Wo Contrast  Result Date: 10/16/2015 CLINICAL DATA:  Brief episode of loss of consciousness followed by a right-sided facial droop, slurred speech, and right arm weakness. Recent total knee arthroplasty. EXAM: MRI HEAD WITHOUT CONTRAST MRA HEAD WITHOUT CONTRAST TECHNIQUE: Multiplanar, multiecho pulse sequences of the brain and surrounding structures were obtained without intravenous contrast. Angiographic images of the head were  obtained using MRA technique without contrast. COMPARISON:  Head CT 10/16/2015 FINDINGS: MRI HEAD FINDINGS Brain: There is no evidence of acute infarct, intracranial hemorrhage, mass, midline shift, or extra-axial fluid collection. The ventricles and sulci are within normal limits for age. Scattered T2 hyperintensities in the subcortical and deep cerebral white matter and pons are nonspecific but compatible with mild chronic small vessel ischemic disease. Vascular: Major intracranial vascular flow voids are preserved. Skull and upper cervical spine: Unremarkable bone marrow signal. Sinuses/Orbits: Prior bilateral cataract extraction. Left maxillary sinus mucous retention cyst. Clear mastoid air cells. Other: None. MRA HEAD FINDINGS The visualized distal vertebral arteries are widely patent  with the left being slightly dominant. Right PICA and left AICA origins are patent. Basilar artery is widely patent. SCA origins are patent. There is a patent right posterior communicating artery. PCAs are patent without evidence of major branch occlusion or significant proximal stenosis. Internal carotid arteries are widely patent from skullbase to carotid termini. ACAs and MCAs are patent without evidence of major branch occlusion or significant proximal stenosis. Left ACA is dominant. No intracranial aneurysm is identified. IMPRESSION: 1. No acute intracranial abnormality. 2. Mild chronic small vessel ischemic disease. 3. Negative head MRA. Electronically Signed   By: Sebastian Ache M.D.   On: 10/16/2015 14:55     Medications:  Scheduled: . acetaminophen  1,000 mg Oral BID  . amoxicillin-clavulanate  1 tablet Oral Q12H  . aspirin EC  81 mg Oral QHS  . atorvastatin  80 mg Oral Daily  . Besifloxacin HCl  1 drop Right Eye TID  . Bromfenac Sodium  1 drop Ophthalmic QHS  . carvedilol  6.25 mg Oral BID WC  . cholecalciferol  2,000 Units Oral Daily  . coumadin book   Does not apply Once  . Difluprednate  1 drop Left Eye 2 times per day  . docusate sodium  100 mg Oral BID  . enoxaparin (LOVENOX) injection  30 mg Subcutaneous Q12H  . ferrous sulfate  325 mg Oral TID PC  . insulin aspart  0-20 Units Subcutaneous TID WC  . insulin aspart  0-5 Units Subcutaneous QHS  . insulin aspart  6 Units Subcutaneous TID WC  . loratadine  10 mg Oral Daily  . lurasidone  40 mg Oral QHS  . omega-3 acid ethyl esters  2 g Oral BID  . pantoprazole  80 mg Oral Daily  . tranexamic acid (CYKLOKAPRON) topical -INTRAOP  2,000 mg Topical Once  . venlafaxine XR  150 mg Oral BID  . Warfarin - Pharmacist Dosing Inpatient   Does not apply q1800   Continuous: . lactated ringers 10 mL/hr at 10/14/15 1157   ZOX:[id number] **OR** acetaminophen, bisacodyl, menthol-cetylpyridinium **OR** phenol, metoCLOPramide **OR**  metoCLOPramide (REGLAN) injection, ondansetron **OR** ondansetron (ZOFRAN) IV, oxyCODONE-acetaminophen, polyethylene glycol  Assessment/Plan:  Active Problems:   H/O total knee replacement   Essential hypertension   Acute encephalopathy   DM (diabetes mellitus), type 2 with renal complications (HCC)   CKD (chronic kidney disease), stage III    Fever with cough/Aspiration pneumonia Patient's chest x-ray did suggest pneumonia in the left lung. He does have a cough with yellowish expectoration. He is noted to have low-grade fever and has leukocytosis. At this time it's reasonable to treat this with antibiotics. He will be started on Augmentin. Apart from being under anesthesia for surgery patient also had an episode of syncope a few days ago. It's quite possible he may have aspirated.  Acute encephalopathy. Slightly multifactorial in  the setting of recent anesthesia. Fever and pneumonia could also be contributing. Ammonia level was normal. Continue to monitor for now. No focal deficits. Also seen by neurology.  Diabetes mellitus type 2 with renal complications. Monitor CBGs.  History of chronic kidney disease stage III with mild acute kidney injury Noted to be somewhat dehydrated yesterday and was given gentle IV hydration with improvement in renal function. Okay to stop IV fluids.  History of essential hypertension Blood pressures are reasonably well controlled. Holding lisinopril.  DVT Prophylaxis: Warfarin was initiated by ortho for DVT prophylaxis.    Code Status: Full code  Family Communication: Discussed with the patient and wife   Thank you for this consult. We will continue to follow on a daily basis.    LOS: 4 days   [facility]  [facility] Pager [phone] 10/18/2015, 12:37 PM  If 7PM-7AM, please contact night-coverage at [email or web address], password [facility]

## 2015-10-18 NOTE — Clinical Social Work Note (Signed)
Clinical Social Work Assessment  Patient Details  Name: Devon Williams MRN: [id number] Date of Birth: 1946-01-03  Date of referral:  10/18/15               Reason for consult:  Facility Placement                Permission sought to share information with:  Chartered certified accountant granted to share information::  Yes, Verbal Permission Granted  Name::      Devon Williams)  Agency::   (SNFs)  Relationship::   (spouse)  Contact Information:     Housing/Transportation Living arrangements for the past 2 months:  [place] of Information:  Patient Patient Interpreter Needed:  None Criminal Activity/Legal Involvement Pertinent to Current Situation/Hospitalization:  No - Comment as needed Significant Relationships:  Spouse Lives with:  Spouse Do you feel safe going back to the place where you live?  No Need for family participation in patient care:  No (Coment)  Care giving concerns:  No caregiving concerns offered at time of assessment   Social Worker assessment / plan:  CSW met with patient and patient's spouse to review discharge plans. 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  CSW explained the VA process and offered education for STR and VA to both patient and patient's wife.  Employment status:  Retired Forensic scientist:  VA Benefit PT Recommendations:  [place] / Referral to community resources:  [place]  Patient/Family's Response to care:  Agreeable to SNF  Patient/Family's Understanding of and Emotional Response to Diagnosis, Current Treatment, and Prognosis:  Patient and spouse are both very involved in care.  No emotional response offered.  Emotional Assessment Appearance:  Appears stated age Attitude/Demeanor/Rapport:    Affect (typically observed):  Accepting Orientation:  Oriented to Self, Oriented to Place, Oriented to  Time, Oriented to Situation Alcohol / Substance use:  Not  Applicable Psych involvement (Current and /or in the community):  No (Comment)  Discharge Needs  Concerns to be addressed:  No discharge needs identified Readmission within the last 30 days:  No Current discharge risk:  None Barriers to Discharge:  Ship broker, Continued Medical Work up   [employer], LCSW 10/18/2015, 10:48 AM

## 2015-10-18 NOTE — Progress Notes (Signed)
Orthopedic Tech Progress Note Patient Details:  Devon SangerFred Williams 10/29/1945 [id number]  Ortho Devices Ortho Device/Splint Location: footsie roll Ortho Device/Splint Interventions: Ordered, Application, Adjustment  Trapeze bar patient helper Devon Williams, Devon Williams 10/18/2015, 7:49 AM

## 2015-10-19 LAB — GLUCOSE, CAPILLARY
GLUCOSE-CAPILLARY: 198 mg/dL — AB (ref 65–99)
GLUCOSE-CAPILLARY: 230 mg/dL — AB (ref 65–99)
GLUCOSE-CAPILLARY: 236 mg/dL — AB (ref 65–99)
Glucose-Capillary: 170 mg/dL — ABNORMAL HIGH (ref 65–99)

## 2015-10-19 LAB — URINE CULTURE: CULTURE: NO GROWTH

## 2015-10-19 LAB — PROTIME-INR
INR: 1.64
Prothrombin Time: 19.6 seconds — ABNORMAL HIGH (ref 11.4–15.2)

## 2015-10-19 MED ORDER — WARFARIN SODIUM 5 MG PO TABS
10.0000 mg | ORAL_TABLET | Freq: Once | ORAL | Status: AC
  Administered 2015-10-19: 10 mg via ORAL
  Filled 2015-10-19: qty 2

## 2015-10-19 NOTE — Progress Notes (Signed)
[facility] PROGRESS NOTE  Fatima SangerFred Weedman ZOX:[id number] DOB: 11/11/1945 DOA: 10/14/2015  PCP: Elijio MilesWEAVER,JOHN W, MD  Brief History/Interval Summary: 70 year old Caucasian male with a past medical history of hyperlipidemia, HTN, carotid artery disease, GERD, depression, diabetes, CKD III [facility] Patient underwent left knee arthroplasty on 9/22. Hospital course was complicated by transient loss of consciousness with perhaps some right-sided weakness. Code stroke was called. Patient was seen by neurology. Patient underwent MRI which did not reveal any stroke. His symptoms were thought to be due to orthostatic hypotension. Patient was monitored on the floor. Medicine was consulted last night due to fever and persistent confusion.  Reason for Visit: Acute encephalopathy and pneumonia  Antibiotics: Augmentin initiated 9/26  Subjective/Interval History: Seen with his wife at bedside, appears to be very awake, alert and oriented 3  ROS: Denies any nausea, vomiting.  Objective:  Vital Signs  Vitals:   10/18/15 2004 10/19/15 0410 10/19/15 0517 10/19/15 0815  BP: 128/65  (!) 118/59 (!) 112/58  Pulse: 85  73 73  Resp: 16  17   Temp: 100.2 F (37.9 C) 98 F (36.7 C) 98.4 F (36.9 C)   TempSrc: Oral Oral Oral   SpO2: 100%  95% 95%  Weight:        Intake/Output Summary (Last 24 hours) at 10/19/15 1204 Last data filed at 10/19/15 1129  Gross per 24 hour  Intake              240 ml  Output             1180 ml  Net             -940 ml   Filed Weights   10/14/15 1147  Weight: 125.6 kg (277 lb)    General appearance: alert, cooperative, appears stated age and no distress Resp: Diminished entry at the bases. No definite crackles or wheezing. Cardio: regular rate and rhythm, S1, S2 normal, no murmur, click, rub or gallop GI: soft, non-tender; bowel sounds normal; no masses,  no organomegaly Extremities: Left lower extremity in an  immobilizer. Neurologic: Awake and alert. Oriented to person, place and year or month. He did get the date incorrect. No cranial nerve deficits. No motor strength deficits.  Lab Results:  Data Reviewed: I have personally reviewed following labs and imaging studies  CBC:  Recent Labs Lab 10/14/15 1825 10/15/15 0348 10/16/15 0228 10/17/15 0503 10/18/15 0304  WBC 13.9* 13.6* 19.0* 13.9* 14.1*  NEUTROABS  --   --   --   --  10.3*  HGB 14.0 13.9 12.2* 11.5* 11.2*  HCT 42.7 43.0 38.7* 34.8* 34.5*  MCV 94.3 95.1 94.9 92.1 92.5  PLT 205 219 208 185 219    Basic Metabolic Panel:  Recent Labs Lab 10/14/15 1825 10/15/15 0348 10/18/15 0304  NA  --  134* 132*  K  --  4.5 3.9  CL  --  101 101  CO2  --  24 23  GLUCOSE  --  174* 171*  BUN  --  19 17  CREATININE 1.44* 1.53* 1.45*  CALCIUM  --  9.0 8.9    GFR: Estimated Creatinine Clearance: 64.9 mL/min (by C-G formula based on SCr of 1.45 mg/dL (H)).  Liver Function Tests:  Recent Labs Lab 10/18/15 0304  AST 68*  ALT 29  ALKPHOS 95  BILITOT 0.7  PROT 5.9*  ALBUMIN 2.3*     Recent Labs Lab 10/17/15 2209  AMMONIA 16  Coagulation Profile:  Recent Labs Lab 10/15/15 0348 10/16/15 0228 10/17/15 0503 10/18/15 0304 10/19/15 0538  INR 1.05 1.28 1.16 1.45 1.64    Cardiac Enzymes:  Recent Labs Lab 10/17/15 2209  TROPONINI <0.03    HbA1C:  Recent Labs  10/17/15 0503  HGBA1C 7.2*    CBG:  Recent Labs Lab 10/18/15 1114 10/18/15 1630 10/18/15 2148 10/19/15 0648 10/19/15 1133  GLUCAP 199* 183* 221* 170* 198*    Lipid Profile:  Recent Labs  10/17/15 0503  CHOL 131  HDL 44  LDLCALC 64  TRIG 113  CHOLHDL 3.0    Radiology Studies: Dg Chest 2 View  Result Date: 10/18/2015 CLINICAL DATA:  Acute onset of fever and knee pain. Initial encounter. EXAM: CHEST  2 VIEW COMPARISON:  None. FINDINGS: The lungs are well-aerated. Mild vascular congestion is noted. Mildly increased left-sided  interstitial markings and left basilar opacity may reflect mild asymmetric interstitial edema or possibly pneumonia. There is no evidence of pleural effusion or pneumothorax. The heart is normal in size; the mediastinal contour is within normal limits. No acute osseous abnormalities are seen. IMPRESSION: Mild vascular congestion noted. Mildly increased left-sided interstitial markings and left basilar opacity may reflect mild asymmetric interstitial edema or possibly pneumonia. Electronically Signed   By: Roanna Raider M.D.   On: 10/18/2015 03:30     Medications:  Scheduled: . acetaminophen  1,000 mg Oral BID  . amoxicillin-clavulanate  1 tablet Oral Q12H  . aspirin EC  81 mg Oral QHS  . atorvastatin  80 mg Oral Daily  . Besifloxacin HCl  1 drop Right Eye TID  . Bromfenac Sodium  1 drop Ophthalmic QHS  . carvedilol  6.25 mg Oral BID WC  . cholecalciferol  2,000 Units Oral Daily  . coumadin book   Does not apply Once  . Difluprednate  1 drop Left Eye 2 times per day  . docusate sodium  100 mg Oral BID  . enoxaparin (LOVENOX) injection  30 mg Subcutaneous Q12H  . ferrous sulfate  325 mg Oral TID PC  . insulin aspart  0-20 Units Subcutaneous TID WC  . insulin aspart  0-5 Units Subcutaneous QHS  . insulin aspart  6 Units Subcutaneous TID WC  . loratadine  10 mg Oral Daily  . lurasidone  40 mg Oral QHS  . omega-3 acid ethyl esters  2 g Oral BID  . pantoprazole  80 mg Oral Daily  . venlafaxine XR  150 mg Oral BID  . Warfarin - Pharmacist Dosing Inpatient   Does not apply q1800   Continuous: . lactated ringers 10 mL/hr at 10/14/15 1157   GEX:[id number] **OR** acetaminophen, bisacodyl, menthol-cetylpyridinium **OR** phenol, metoCLOPramide **OR** metoCLOPramide (REGLAN) injection, ondansetron **OR** ondansetron (ZOFRAN) IV, oxyCODONE-acetaminophen, polyethylene glycol  Assessment/Plan:  Active Problems:   H/O total knee replacement   Essential hypertension   Acute encephalopathy    DM (diabetes mellitus), type 2 with renal complications (HCC)   CKD (chronic kidney disease), stage III   Pneumonia    Fever with cough/Aspiration pneumonia Patient's chest x-ray did suggest pneumonia in the left lung. He does have a cough with yellowish expectoration.  He is noted to have low-grade fever and has leukocytosis.  Started on Augmentin (for total of 7 days), doing well. Continue supportive management with bronchodilators, mucolytics, antitussives and oxygen as needed.  Acute encephalopathy. Slightly multifactorial in the setting of recent anesthesia. Fever and pneumonia could also be contributing. Ammonia level was normal. Continue to monitor for now.  No focal deficits. Also seen by neurology.  Diabetes mellitus type 2 with renal complications. Monitor CBGs.  History of chronic kidney disease stage III with mild acute kidney injury Noted to be somewhat dehydrated yesterday and was given gentle IV hydration with improvement in renal function. Okay to stop IV fluids.  History of essential hypertension Blood pressures are reasonably well controlled. Holding lisinopril.  DVT Prophylaxis: Warfarin was initiated by ortho for DVT prophylaxis.    Code Status: Full code  Family Communication: Discussed with the patient and wife   Thank you for this consult. We will continue to follow on a daily basis.    LOS: 5 days   [facility]  [facility] Pager [phone] 10/19/2015, 12:04 PM  If 7PM-7AM, please contact night-coverage at [email or web address], password [facility]

## 2015-10-19 NOTE — Progress Notes (Signed)
ANTICOAGULATION CONSULT NOTE - Follow Up Consult  Pharmacy Consult for Coumadin Indication: VTE prophylaxis  Allergies  Allergen Reactions  . Nabumetone Other (See Comments)    UNSPECIFIED REACTION     Patient Measurements: Weight: 277 lb (125.6 kg)  Vital Signs: Temp: 98.6 F (37 C) (09/27 1300) Temp Source: Oral (09/27 1300) BP: 111/65 (09/27 1300) Pulse Rate: 83 (09/27 1300)  Labs:  Recent Labs  10/17/15 0503 10/17/15 2209 10/18/15 0304 10/19/15 0538  HGB 11.5*  --  11.2*  --   HCT 34.8*  --  34.5*  --   PLT 185  --  219  --   LABPROT 14.8  --  17.7* 19.6*  INR 1.16  --  1.45 1.64  CREATININE  --   --  1.45*  --   TROPONINI  --  <0.03  --   --     Estimated Creatinine Clearance: 64.9 mL/min (by C-G formula based on SCr of 1.45 mg/dL (H)).   Medications:  Scheduled:  . acetaminophen  1,000 mg Oral BID  . amoxicillin-clavulanate  1 tablet Oral Q12H  . aspirin EC  81 mg Oral QHS  . atorvastatin  80 mg Oral Daily  . Besifloxacin HCl  1 drop Right Eye TID  . Bromfenac Sodium  1 drop Ophthalmic QHS  . carvedilol  6.25 mg Oral BID WC  . cholecalciferol  2,000 Units Oral Daily  . coumadin book   Does not apply Once  . Difluprednate  1 drop Left Eye 2 times per day  . docusate sodium  100 mg Oral BID  . enoxaparin (LOVENOX) injection  30 mg Subcutaneous Q12H  . ferrous sulfate  325 mg Oral TID PC  . insulin aspart  0-20 Units Subcutaneous TID WC  . insulin aspart  0-5 Units Subcutaneous QHS  . insulin aspart  6 Units Subcutaneous TID WC  . loratadine  10 mg Oral Daily  . lurasidone  40 mg Oral QHS  . omega-3 acid ethyl esters  2 g Oral BID  . pantoprazole  80 mg Oral Daily  . venlafaxine XR  150 mg Oral BID  . Warfarin - Pharmacist Dosing Inpatient   Does not apply q1800    Assessment: [age] YOM s/p left TKA on Coumadin/Lovenox for VTE px s/p left TKA on 9/22, no anticoagulation PTA.  INR with better increase over last 2 days.  No bleeding noted.  Goal of  Therapy:  INR 2-3 Monitor platelets by anticoagulation protocol: Yes   Plan:  Repeat Coumadin 10mg  today F/U INR   Marisue HumbleKendra Arliss Frisina, PharmD Clinical Pharmacist [facility]

## 2015-10-19 NOTE — Progress Notes (Signed)
Physical Therapy Treatment Patient Details Name: Devon SangerFred Argyle MRN: [id number] DOB: 10/04/1945 Today's Date: 10/19/2015    History of Present Illness Patient admitted for elective TKR on left.  PMH significant for DM, chronic kidney disease    PT Comments    Pt a little more alert today and even able to give multi-word answers to questions.  Pt starting to follow some 2 step directions and retain directions during repetitive task.  Feel pt will still need SNF level of care and therapies at D/C.  Will continue to follow.    Follow Up Recommendations  SNF     Equipment Recommendations  Rolling walker with 5" wheels    Recommendations for Other Services       Precautions / Restrictions Precautions Precautions: Knee;Fall Required Braces or Orthoses: Knee Immobilizer - Left Knee Immobilizer - Left: Discontinue once straight leg raise with < 10 degree lag Restrictions Weight Bearing Restrictions: Yes LLE Weight Bearing: Weight bearing as tolerated    Mobility  Bed Mobility Overal bed mobility: Needs Assistance;+2 for physical assistance Bed Mobility: Supine to Sit     Supine to sit: Mod assist;+2 for physical assistance;HOB elevated     General bed mobility comments: A with bringing L LE towards EOB, scooting hips to EOB, and bringing trunk up to sitting.  pt does follow cues for technique when given time to process.    Transfers Overall transfer level: Needs assistance Equipment used: Rolling walker (2 wheeled) Transfers: Sit to/from Stand Sit to Stand: Max assist;+2 physical assistance         General transfer comment: cues for UE use, positioning of LEs, and controlling descent to sitting.  pt able to perform coming to/from standing x5 working on strength and technique.    Ambulation/Gait Ambulation/Gait assistance: Min assist;+2 physical assistance;+2 safety/equipment Ambulation Distance (Feet): 10 Feet Assistive device: Rolling walker (2 wheeled) Gait  Pattern/deviations: Step-to pattern;Decreased step length - right;Decreased stance time - left;Trunk flexed     General Gait Details: pt needs cues for upright posture, positioning within RW, gait sequencing, and encouragement.     Stairs            Wheelchair Mobility    Modified Rankin (Stroke Patients Only)       Balance Overall balance assessment: Needs assistance Sitting-balance support: Bilateral upper extremity supported;Feet supported Sitting balance-Leahy Scale: Poor     Standing balance support: Bilateral upper extremity supported;During functional activity Standing balance-Leahy Scale: Poor                      Cognition Arousal/Alertness:  (Drowsy, but more arousable than previous sessions.) Behavior During Therapy: Flat affect Overall Cognitive Status: Impaired/Different from baseline Area of Impairment: Attention;Memory;Following commands;Safety/judgement;Awareness;Problem solving   Current Attention Level: Selective Memory: Decreased short-term memory Following Commands: Follows one step commands with increased time;Follows multi-step commands inconsistently Safety/Judgement: Decreased awareness of safety;Decreased awareness of deficits Awareness: Emergent Problem Solving: Slow processing;Decreased initiation;Difficulty sequencing;Requires verbal cues;Requires tactile cues General Comments: pt oriented today and a little more alert.  pt able to follow some 2 step directions and was able to recall directions during repetitive task.  pt still drowsy and slow to process, but participating.      Exercises Total Joint Exercises Quad Sets: AROM;Both;10 reps Long Arc Quad: AAROM;Left;10 reps Knee Flexion: AAROM;Left;10 reps Goniometric ROM: AAROM ~ 10 - 50    General Comments        Pertinent Vitals/Pain Pain Assessment: 0-10 Pain Score: 4  Pain Location: L knee Pain Descriptors / Indicators: Sore Pain Intervention(s): Monitored during  session;Premedicated before session;Repositioned    Home Living                      Prior Function            PT Goals (current goals can now be found in the care plan section) Acute Rehab PT Goals Patient Stated Goal: Per wife for pt to fully recover. PT Goal Formulation: With patient/family Time For Goal Achievement: 10/22/15 Potential to Achieve Goals: Good Progress towards PT goals: Progressing toward goals    Frequency    7X/week      PT Plan Current plan remains appropriate    Co-evaluation             End of Session Equipment Utilized During Treatment: Gait belt;Left knee immobilizer Activity Tolerance: Patient limited by fatigue Patient left: in chair;with call bell/phone within reach;with family/visitor present     Time: 1610-9604 PT Time Calculation (min) (ACUTE ONLY): 29 min  Charges:  $Gait Training: 8-22 mins $Therapeutic Exercise: 8-22 mins                    G CodesSunny Schlein, Selz [phone] 10/19/2015, 12:32 PM

## 2015-10-19 NOTE — Clinical Social Work Note (Addendum)
2:41pm- CS[facility] not [facility].  Review is being held until patient is cleared medically.  [place] [facility] [facility] [place] [phone] (fax);  2:20pm- CSW spoke with wife regarding placement options.  Wife acknowledges understanding.  CSW awaiting [facility] decision.  Information was faxed by covering CSW on 9/26.  CSW confirmed the [facility] has received the referral.  11:30am- [facility] contacted [facility] to inquire regarding [facility] for SNF and bed availability.  CSW was informed the case will be reviewed today and a decision should be made around 3pm today.  CSW will continue to assist with discharge.

## 2015-10-19 NOTE — Progress Notes (Signed)
Inpatient Diabetes Program Recommendations  AACE/ADA: New Consensus Statement on Inpatient Glycemic Control (2015)  Target Ranges:  Prepandial:   less than 140 mg/dL      Peak postprandial:   less than 180 mg/dL (1-2 hours)      Critically ill patients:  140 - 180 mg/dL   Results for Fatima SangerSMITH, Dandrae (MRN [id number]) as of 10/19/2015 10:09  Ref. Range 10/18/2015 06:20 10/18/2015 11:14 10/18/2015 16:30 10/18/2015 21:48 10/19/2015 06:48  Glucose-Capillary Latest Ref Range: 65 - 99 mg/dL 409193 (H) 811199 (H) 914183 (H) 221 (H) 170 (H)  Results for Fatima SangerSMITH, Cobey (MRN [id number]) as of 10/19/2015 10:09  Ref. Range 10/17/2015 05:03  Hemoglobin A1C Latest Ref Range: 4.8 - 5.6 % 7.2 (H)    Review of Glycemic Control  Diabetes history: DM2 Outpatient Diabetes medications: Glipizide 10 mg BID, Tradjenta 5 mg daily Current orders for Inpatient glycemic control:Resistant Correction Scale, Novolog 6 units TID WC  Inpatient Diabetes Program Recommendations: Please consider adding home dose of Tradjenta 5 mg daily.  Thank you,  Kristine LineaKaren Aamira Bischoff, RN, BSN Diabetes Coordinator Inpatient Diabetes Program [phone] (Team Pager) [phone] ([facility] office) [phone] [facility] office) [phone] [facility] office)

## 2015-10-19 NOTE — Progress Notes (Signed)
   Subjective: 5 Days Post-Op Procedure(s) (LRB): LEFT TOTAL KNEE ARTHROPLASTY (Left)  Pt seen along with Dr. Ranell PatrickNorris during rounds Pt much more alert today vs previous days Denies pain in the left knee unless he moves the knee Still able to progress mildly with therapy Noted new findings of pneumonia and medial team recommendations Patient reports pain as mild.  Objective:   VITALS:   Vitals:   10/19/15 0517 10/19/15 0815  BP: (!) 118/59 (!) 112/58  Pulse: 73 73  Resp: 17   Temp: 98.4 F (36.9 C)     Left knee incision healing well No erythema Minimal drainage noted from nursing staff, dressing changed today to aquacel  LABS  Recent Labs  10/17/15 0503 10/18/15 0304  HGB 11.5* 11.2*  HCT 34.8* 34.5*  WBC 13.9* 14.1*  PLT 185 219     Recent Labs  10/18/15 0304  NA 132*  K 3.9  BUN 17  CREATININE 1.45*  GLUCOSE 171*     Assessment/Plan: 5 Days Post-Op Procedure(s) (LRB): LEFT TOTAL KNEE ARTHROPLASTY (Left) Will continue to monitor his mentation and rehab Possible d/c tomorrow vs Friday to SNF depending on progress and medical team's assessment Continue PT/OT augmentin orally for pneumonia Discussed plan and case with his wife and she is in agreement    [employer] MillsBrad Brettney Ficken, MPAS, PA-C  10/19/2015, 1:28 PM

## 2015-10-19 NOTE — Progress Notes (Signed)
Orthopedic Tech Progress Note Patient Details:  Devon SangerFred Williams 05/19/1945 [id number] Ortho visit put on cpm at 1830 Patient ID: Devon SangerFred Williams, male   DOB: 10/05/1945, 70 y.o.   MRN: [id number]   [name], [name] Craig 10/19/2015, 6:30 PM

## 2015-10-20 LAB — GLUCOSE, CAPILLARY
GLUCOSE-CAPILLARY: 304 mg/dL — AB (ref 65–99)
GLUCOSE-CAPILLARY: 209 mg/dL — AB (ref 65–99)
Glucose-Capillary: 139 mg/dL — ABNORMAL HIGH (ref 65–99)

## 2015-10-20 LAB — PROTIME-INR
INR: 2.02
Prothrombin Time: 23.2 seconds — ABNORMAL HIGH (ref 11.4–15.2)

## 2015-10-20 MED ORDER — WARFARIN SODIUM 5 MG PO TABS: 5.0000 mg | ORAL_TABLET | Freq: Once | ORAL | Status: DC

## 2015-10-20 MED ORDER — AMOXICILLIN-POT CLAVULANATE 875-125 MG PO TABS: 1.0000 | ORAL_TABLET | Freq: Two times a day (BID) | ORAL | 0 refills | Status: DC

## 2015-10-20 MED ORDER — WARFARIN SODIUM 6 MG PO TABS
6.0000 mg | ORAL_TABLET | Freq: Once | ORAL | Status: AC
  Administered 2015-10-20: 6 mg via ORAL
  Filled 2015-10-20: qty 1

## 2015-10-20 NOTE — Progress Notes (Signed)
Orthopedics Progress Note  Subjective: Patient reports feeling better  Objective:  Vitals:   10/19/15 2100 10/20/15 0531  BP: 135/63 126/71  Pulse: 81 81  Resp:  20  Temp: 99.7 F (37.6 C) 98.6 F (37 C)    General: Awake and alert  Musculoskeletal: left knee dressing intact, calf pumps with no pain Neurovascularly intact  Lab Results  Component Value Date   WBC 14.1 (H) 10/18/2015   HGB 11.2 (L) 10/18/2015   HCT 34.5 (L) 10/18/2015   MCV 92.5 10/18/2015   PLT 219 10/18/2015       Component Value Date/Time   NA 132 (L) 10/18/2015 0304   K 3.9 10/18/2015 0304   CL 101 10/18/2015 0304   CO2 23 10/18/2015 0304   GLUCOSE 171 (H) 10/18/2015 0304   BUN 17 10/18/2015 0304   CREATININE 1.45 (H) 10/18/2015 0304   CALCIUM 8.9 10/18/2015 0304   GFRNONAA 48 (L) 10/18/2015 0304   GFRAA 55 (L) 10/18/2015 0304    Lab Results  Component Value Date   INR 2.02 10/20/2015   INR 1.64 10/19/2015   INR 1.45 10/18/2015    Assessment/Plan: POD #5 s/p Procedure(s):  LEFT TOTAL KNEE ARTHROPLASTY Clinically improved - will discharge on Augmentin Will need short term rehab facility - request [facility] D/C plaaning  Almedia BallsSteven R. Ranell PatrickNorris, MD 10/20/2015 7:43 AM

## 2015-10-20 NOTE — Progress Notes (Signed)
Orthopedic Tech Progress Note Patient Details:  Devon Williams 08/21/1945 [id number]  Patient ID: Devon SangerFred Friddle, male   DOB: 05/23/1945, 70 y.o.   MRN: [id number] Pt refused cpm  [name], [name] 10/20/2015, 5:57 AM

## 2015-10-20 NOTE — Discharge Summary (Addendum)
Physician Discharge Summary   Patient ID: Devon Williams MRN: [id number] DOB/AGE: 69/14/1947 69 y.o.  Admit date: 10/14/2015 Discharge date: 10/20/2015  Admission Diagnoses:  Active Problems:   H/O total knee replacement   Essential hypertension   Acute encephalopathy   DM (diabetes mellitus), type 2 with renal complications (HCC)   CKD (chronic kidney disease), stage III   Pneumonia   Discharge Diagnoses:  Same   Surgeries: Procedure(s): LEFT TOTAL KNEE ARTHROPLASTY on 10/14/2015   Consultants: Neurology, Internal medicine, OT, PT  Discharged Condition: Stable  Hospital Course: Devon Williams is an 70 y.o. male who was admitted 10/14/2015 with a chief complaint of left knee pain, and found to have a diagnosis of left knee primary OA.  They were brought to the operating room on 10/14/2015 and underwent the above named procedures.    The patient had an hospital course complicated by port op pneumonia and orthostatic hypotension.  Neurology was consulted for AMS and there was a negative workup for stroke.  Internal medicine was consulted and diagnosed LLL pneumonia starting po augmentin. Patient improved clinically and was stable for discharge.  Recent vital signs:  Vitals:   10/19/15 2100 10/20/15 0531  BP: 135/63 126/71  Pulse: 81 81  Resp:  20  Temp: 99.7 F (37.6 C) 98.6 F (37 C)    Recent laboratory studies:  Results for orders placed or performed during the hospital encounter of 10/14/15  Urine culture  Result Value Ref Range   Specimen Description URINE, CLEAN CATCH    Special Requests NONE    Culture NO GROWTH    Report Status 10/19/2015 FINAL   Glucose, capillary  Result Value Ref Range   Glucose-Capillary 120 (H) 65 - 99 mg/dL   Comment 1 Notify RN    Comment 2 Document in Chart   Glucose, capillary  Result Value Ref Range   Glucose-Capillary 154 (H) 65 - 99 mg/dL  Protime-INR  Result Value Ref Range   Prothrombin Time 13.7 11.4 - 15.2 seconds   INR 1.05    CBC  Result Value Ref Range   WBC 13.9 (H) 4.0 - 10.5 K/uL   RBC 4.53 4.22 - 5.81 MIL/uL   Hemoglobin 14.0 13.0 - 17.0 g/dL   HCT 56.2 13.0 - 86.5 %   MCV 94.3 78.0 - 100.0 fL   MCH 30.9 26.0 - 34.0 pg   MCHC 32.8 30.0 - 36.0 g/dL   RDW 78.4 69.6 - 29.5 %   Platelets 205 150 - 400 K/uL  Creatinine, serum  Result Value Ref Range   Creatinine, Ser 1.44 (H) 0.61 - 1.24 mg/dL   GFR calc non Af Amer 48 (L) >60 mL/min   GFR calc Af Amer 56 (L) >60 mL/min  CBC  Result Value Ref Range   WBC 13.6 (H) 4.0 - 10.5 K/uL   RBC 4.52 4.22 - 5.81 MIL/uL   Hemoglobin 13.9 13.0 - 17.0 g/dL   HCT 28.4 13.2 - 44.0 %   MCV 95.1 78.0 - 100.0 fL   MCH 30.8 26.0 - 34.0 pg   MCHC 32.3 30.0 - 36.0 g/dL   RDW 10.2 72.5 - 36.6 %   Platelets 219 150 - 400 K/uL  Basic metabolic panel  Result Value Ref Range   Sodium 134 (L) 135 - 145 mmol/L   Potassium 4.5 3.5 - 5.1 mmol/L   Chloride 101 101 - 111 mmol/L   CO2 24 22 - 32 mmol/L   Glucose, Bld 174 (H) 65 -  99 mg/dL   BUN 19 6 - 20 mg/dL   Creatinine, Ser 8.291.53 (H) 0.61 - 1.24 mg/dL   Calcium 9.0 8.9 - 56.210.3 mg/dL   GFR calc non Af Amer 45 (L) >60 mL/min   GFR calc Af Amer 52 (L) >60 mL/min   Anion gap 9 5 - 15  Glucose, capillary  Result Value Ref Range   Glucose-Capillary 182 (H) 65 - 99 mg/dL  Glucose, capillary  Result Value Ref Range   Glucose-Capillary 170 (H) 65 - 99 mg/dL  Glucose, capillary  Result Value Ref Range   Glucose-Capillary 191 (H) 65 - 99 mg/dL   Comment 1 Notify RN   Glucose, capillary  Result Value Ref Range   Glucose-Capillary 153 (H) 65 - 99 mg/dL  Protime-INR  Result Value Ref Range   Prothrombin Time 16.1 (H) 11.4 - 15.2 seconds   INR 1.28   CBC  Result Value Ref Range   WBC 19.0 (H) 4.0 - 10.5 K/uL   RBC 4.08 (L) 4.22 - 5.81 MIL/uL   Hemoglobin 12.2 (L) 13.0 - 17.0 g/dL   HCT 13.038.7 (L) 86.539.0 - 78.452.0 %   MCV 94.9 78.0 - 100.0 fL   MCH 29.9 26.0 - 34.0 pg   MCHC 31.5 30.0 - 36.0 g/dL   RDW 69.613.6 29.511.5 - 28.415.5 %    Platelets 208 150 - 400 K/uL  Glucose, capillary  Result Value Ref Range   Glucose-Capillary 174 (H) 65 - 99 mg/dL  Glucose, capillary  Result Value Ref Range   Glucose-Capillary 169 (H) 65 - 99 mg/dL  Glucose, capillary  Result Value Ref Range   Glucose-Capillary 193 (H) 65 - 99 mg/dL  Glucose, capillary  Result Value Ref Range   Glucose-Capillary 203 (H) 65 - 99 mg/dL   Comment 1 Notify RN   Glucose, capillary  Result Value Ref Range   Glucose-Capillary 196 (H) 65 - 99 mg/dL   Comment 1 Notify RN    Comment 2 Document in Chart   Protime-INR  Result Value Ref Range   Prothrombin Time 14.8 11.4 - 15.2 seconds   INR 1.16   CBC  Result Value Ref Range   WBC 13.9 (H) 4.0 - 10.5 K/uL   RBC 3.78 (L) 4.22 - 5.81 MIL/uL   Hemoglobin 11.5 (L) 13.0 - 17.0 g/dL   HCT 13.234.8 (L) 44.039.0 - 10.252.0 %   MCV 92.1 78.0 - 100.0 fL   MCH 30.4 26.0 - 34.0 pg   MCHC 33.0 30.0 - 36.0 g/dL   RDW 72.513.5 36.611.5 - 44.015.5 %   Platelets 185 150 - 400 K/uL  Hemoglobin A1c  Result Value Ref Range   Hgb A1c MFr Bld 7.2 (H) 4.8 - 5.6 %   Mean Plasma Glucose 160 mg/dL  Lipid panel  Result Value Ref Range   Cholesterol 131 0 - 200 mg/dL   Triglycerides 347113 <425<150 mg/dL   HDL 44 >95>40 mg/dL   Total CHOL/HDL Ratio 3.0 RATIO   VLDL 23 0 - 40 mg/dL   LDL Cholesterol 64 0 - 99 mg/dL  Glucose, capillary  Result Value Ref Range   Glucose-Capillary 181 (H) 65 - 99 mg/dL  Glucose, capillary  Result Value Ref Range   Glucose-Capillary 178 (H) 65 - 99 mg/dL  Glucose, capillary  Result Value Ref Range   Glucose-Capillary 188 (H) 65 - 99 mg/dL  Glucose, capillary  Result Value Ref Range   Glucose-Capillary 120 (H) 65 - 99 mg/dL  Protime-INR  Result  Value Ref Range   Prothrombin Time 17.7 (H) 11.4 - 15.2 seconds   INR 1.45   Lactic acid, plasma  Result Value Ref Range   Lactic Acid, Venous 1.3 0.5 - 1.9 mmol/L  Blood gas, venous  Result Value Ref Range   FIO2 0.21    pH, Ven 7.397 7.250 - 7.430   pCO2, Ven 37.8  (L) 44.0 - 60.0 mmHg   pO2, Ven 58.4 (H) 32.0 - 45.0 mmHg   Bicarbonate 22.8 20.0 - 28.0 mmol/L   Acid-base deficit 1.4 0.0 - 2.0 mmol/L   O2 Saturation 89.7 %   Patient temperature 98.6    Collection site VEIN    Sample type VENOUS   Ammonia  Result Value Ref Range   Ammonia 16 9 - 35 umol/L  Urinalysis, Routine w reflex microscopic (not at [facility])  Result Value Ref Range   Color, Urine YELLOW YELLOW   APPearance CLEAR CLEAR   Specific Gravity, Urine 1.015 1.005 - 1.030   pH 7.0 5.0 - 8.0   Glucose, UA 500 (A) NEGATIVE mg/dL   Hgb urine dipstick SMALL (A) NEGATIVE   Bilirubin Urine NEGATIVE NEGATIVE   Ketones, ur NEGATIVE NEGATIVE mg/dL   Protein, ur 30 (A) NEGATIVE mg/dL   Nitrite NEGATIVE NEGATIVE   Leukocytes, UA NEGATIVE NEGATIVE  CBC with Differential/Platelet  Result Value Ref Range   WBC 14.1 (H) 4.0 - 10.5 K/uL   RBC 3.73 (L) 4.22 - 5.81 MIL/uL   Hemoglobin 11.2 (L) 13.0 - 17.0 g/dL   HCT 81.1 (L) 91.4 - 78.2 %   MCV 92.5 78.0 - 100.0 fL   MCH 30.0 26.0 - 34.0 pg   MCHC 32.5 30.0 - 36.0 g/dL   RDW 95.6 21.3 - 08.6 %   Platelets 219 150 - 400 K/uL   Neutrophils Relative % 73 %   Neutro Abs 10.3 (H) 1.7 - 7.7 K/uL   Lymphocytes Relative 16 %   Lymphs Abs 2.2 0.7 - 4.0 K/uL   Monocytes Relative 10 %   Monocytes Absolute 1.5 (H) 0.1 - 1.0 K/uL   Eosinophils Relative 1 %   Eosinophils Absolute 0.1 0.0 - 0.7 K/uL   Basophils Relative 0 %   Basophils Absolute 0.0 0.0 - 0.1 K/uL  Creatinine, urine, random  Result Value Ref Range   Creatinine, Urine 46.67 mg/dL  Sodium, urine, random  Result Value Ref Range   Sodium, Ur 95 mmol/L  Comprehensive metabolic panel  Result Value Ref Range   Sodium 132 (L) 135 - 145 mmol/L   Potassium 3.9 3.5 - 5.1 mmol/L   Chloride 101 101 - 111 mmol/L   CO2 23 22 - 32 mmol/L   Glucose, Bld 171 (H) 65 - 99 mg/dL   BUN 17 6 - 20 mg/dL   Creatinine, Ser 5.78 (H) 0.61 - 1.24 mg/dL   Calcium 8.9 8.9 - 46.9 mg/dL   Total Protein 5.9  (L) 6.5 - 8.1 g/dL   Albumin 2.3 (L) 3.5 - 5.0 g/dL   AST 68 (H) 15 - 41 U/L   ALT 29 17 - 63 U/L   Alkaline Phosphatase 95 38 - 126 U/L   Total Bilirubin 0.7 0.3 - 1.2 mg/dL   GFR calc non Af Amer 48 (L) >60 mL/min   GFR calc Af Amer 55 (L) >60 mL/min   Anion gap 8 5 - 15  Troponin I (q 6hr x 3)  Result Value Ref Range   Troponin I <0.03 <0.03 ng/mL  Glucose, capillary  Result Value Ref Range   Glucose-Capillary 145 (H) 65 - 99 mg/dL  Glucose, capillary  Result Value Ref Range   Glucose-Capillary 193 (H) 65 - 99 mg/dL  Urine microscopic-add on  Result Value Ref Range   Squamous Epithelial / LPF 0-5 (A) NONE SEEN   WBC, UA 0-5 0 - 5 WBC/hpf   RBC / HPF 0-5 0 - 5 RBC/hpf   Bacteria, UA NONE SEEN NONE SEEN  Glucose, capillary  Result Value Ref Range   Glucose-Capillary 199 (H) 65 - 99 mg/dL   Comment 1 Notify RN   Glucose, capillary  Result Value Ref Range   Glucose-Capillary 183 (H) 65 - 99 mg/dL   Comment 1 Notify RN   Protime-INR  Result Value Ref Range   Prothrombin Time 19.6 (H) 11.4 - 15.2 seconds   INR 1.64   Glucose, capillary  Result Value Ref Range   Glucose-Capillary 221 (H) 65 - 99 mg/dL  Glucose, capillary  Result Value Ref Range   Glucose-Capillary 170 (H) 65 - 99 mg/dL  Glucose, capillary  Result Value Ref Range   Glucose-Capillary 198 (H) 65 - 99 mg/dL  Glucose, capillary  Result Value Ref Range   Glucose-Capillary 230 (H) 65 - 99 mg/dL  Protime-INR  Result Value Ref Range   Prothrombin Time 23.2 (H) 11.4 - 15.2 seconds   INR 2.02   Glucose, capillary  Result Value Ref Range   Glucose-Capillary 236 (H) 65 - 99 mg/dL  VAS US CAROTID  Result Value Ref Range   Right CCA prox sys 112 cm/s   Right CCA prox dias 17 cm/s   Right cca dist sys -61 cm/s   Left CCA prox sys 115 cm/s   Left CCA prox dias 19 cm/s   Left CCA dist sys 93 cm/s   Left CCA dist dias 16 cm/s   Left ICA prox sys -68 cm/s   Left ICA prox dias -17 cm/s   Left ICA dist sys -83  cm/s   Left ICA dist dias -25 cm/s   RIGHT ECA DIAS -6.00 cm/s   RIGHT VERTEBRAL DIAS 16.00 cm/s   LEFT ECA DIAS -5.00 cm/s   LEFT VERTEBRAL DIAS -14.00 cm/s    Discharge Medications:     Diagnostic Studies: Dg Chest 2 View  Result Date: 10/18/2015 CLINICAL DATA:  Acute onset of fever and knee pain. Initial encounter. EXAM: CHEST  2 VIEW COMPARISON:  None. FINDINGS: The lungs are well-aerated. Mild vascular congestion is noted. Mildly increased left-sided interstitial markings and left basilar opacity may reflect mild asymmetric interstitial edema or possibly pneumonia. There is no evidence of pleural effusion or pneumothorax. The heart is normal in size; the mediastinal contour is within normal limits. No acute osseous abnormalities are seen. IMPRESSION: Mild vascular congestion noted. Mildly increased left-sided interstitial markings and left basilar opacity may reflect mild asymmetric interstitial edema or possibly pneumonia. Electronically Signed   By: Roanna Raider M.D.   On: 10/18/2015 03:30   Mr Maxine Glenn Head Wo Contrast  Result Date: 10/16/2015 CLINICAL DATA:  Brief episode of loss of consciousness followed by a right-sided facial droop, slurred speech, and right arm weakness. Recent total knee arthroplasty. EXAM: MRI HEAD WITHOUT CONTRAST MRA HEAD WITHOUT CONTRAST TECHNIQUE: Multiplanar, multiecho pulse sequences of the brain and surrounding structures were obtained without intravenous contrast. Angiographic images of the head were obtained using MRA technique without contrast. COMPARISON:  Head CT 10/16/2015 FINDINGS: MRI HEAD FINDINGS Brain: There is no  evidence of acute infarct, intracranial hemorrhage, mass, midline shift, or extra-axial fluid collection. The ventricles and sulci are within normal limits for age. Scattered T2 hyperintensities in the subcortical and deep cerebral white matter and pons are nonspecific but compatible with mild chronic small vessel ischemic disease. Vascular:  Major intracranial vascular flow voids are preserved. Skull and upper cervical spine: Unremarkable bone marrow signal. Sinuses/Orbits: Prior bilateral cataract extraction. Left maxillary sinus mucous retention cyst. Clear mastoid air cells. Other: None. MRA HEAD FINDINGS The visualized distal vertebral arteries are widely patent with the left being slightly dominant. Right PICA and left AICA origins are patent. Basilar artery is widely patent. SCA origins are patent. There is a patent right posterior communicating artery. PCAs are patent without evidence of major branch occlusion or significant proximal stenosis. Internal carotid arteries are widely patent from skullbase to carotid termini. ACAs and MCAs are patent without evidence of major branch occlusion or significant proximal stenosis. Left ACA is dominant. No intracranial aneurysm is identified. IMPRESSION: 1. No acute intracranial abnormality. 2. Mild chronic small vessel ischemic disease. 3. Negative head MRA. Electronically Signed   By: Sebastian Ache M.D.   On: 10/16/2015 14:55   Mr Brain Wo Contrast  Result Date: 10/16/2015 CLINICAL DATA:  Brief episode of loss of consciousness followed by a right-sided facial droop, slurred speech, and right arm weakness. Recent total knee arthroplasty. EXAM: MRI HEAD WITHOUT CONTRAST MRA HEAD WITHOUT CONTRAST TECHNIQUE: Multiplanar, multiecho pulse sequences of the brain and surrounding structures were obtained without intravenous contrast. Angiographic images of the head were obtained using MRA technique without contrast. COMPARISON:  Head CT 10/16/2015 FINDINGS: MRI HEAD FINDINGS Brain: There is no evidence of acute infarct, intracranial hemorrhage, mass, midline shift, or extra-axial fluid collection. The ventricles and sulci are within normal limits for age. Scattered T2 hyperintensities in the subcortical and deep cerebral white matter and pons are nonspecific but compatible with mild chronic small vessel  ischemic disease. Vascular: Major intracranial vascular flow voids are preserved. Skull and upper cervical spine: Unremarkable bone marrow signal. Sinuses/Orbits: Prior bilateral cataract extraction. Left maxillary sinus mucous retention cyst. Clear mastoid air cells. Other: None. MRA HEAD FINDINGS The visualized distal vertebral arteries are widely patent with the left being slightly dominant. Right PICA and left AICA origins are patent. Basilar artery is widely patent. SCA origins are patent. There is a patent right posterior communicating artery. PCAs are patent without evidence of major branch occlusion or significant proximal stenosis. Internal carotid arteries are widely patent from skullbase to carotid termini. ACAs and MCAs are patent without evidence of major branch occlusion or significant proximal stenosis. Left ACA is dominant. No intracranial aneurysm is identified. IMPRESSION: 1. No acute intracranial abnormality. 2. Mild chronic small vessel ischemic disease. 3. Negative head MRA. Electronically Signed   By: Sebastian Ache M.D.   On: 10/16/2015 14:55   Dg Knee Left Port  Result Date: 10/14/2015 CLINICAL DATA:  Left total knee replacement. EXAM: PORTABLE LEFT KNEE - 1-2 VIEW COMPARISON:  None. FINDINGS: Two-view show total knee arthroplasty with components well positioned. No radiographically detectable complication. Air in the joint and soft tissues as expected. IMPRESSION: Good appearance following total knee arthroplasty. Electronically Signed   By: Paulina Fusi M.D.   On: 10/14/2015 15:40   Ct Head Code Stroke Wo Contrast  Result Date: 10/16/2015 CLINICAL DATA:  Code stroke. Slurred speech and right-sided facial droop. EXAM: CT HEAD WITHOUT CONTRAST TECHNIQUE: Contiguous axial images were obtained from the base of  the skull through the vertex without intravenous contrast. COMPARISON:  None. FINDINGS: Brain: There is no evidence of acute cortical infarct, intracranial hemorrhage, mass, midline  shift, or extra-axial fluid collection. There is mild generalized cerebral atrophy. Vascular: No hyperdense vessel or unexpected calcification. Skull: No focal osseous lesion or fracture. Sinuses/Orbits: Left maxillary sinus mucous retention cyst or polyp. Prior bilateral cataract extraction. Other: None. ASPECTS [facility] Stroke Program Early CT Score) - Ganglionic level infarction (caudate, lentiform nuclei, internal capsule, insula, M1-M3 cortex): 7 - Supraganglionic infarction (M4-M6 cortex): 3 Total score (0-10 with 10 being normal): 10 IMPRESSION: 1. No evidence of acute intracranial abnormality. 2. ASPECTS is 10. These results were called by telephone at the time of interpretation on 10/16/2015 at 10:03 am to Dr. Ritta Slot , who verbally acknowledged these results. Electronically Signed   By: Sebastian Ache M.D.   On: 10/16/2015 10:05    Disposition: SNF    Follow-up Information    Nadege Carriger,STEVEN R, MD. Call in 2 weeks.   Specialty:  Orthopedic Surgery Why:  [phone] Contact information: [address] [place] [address] [phone]          Patient continues to improve while waiting for [facility] [employer] authorization Minimal need for pain meds Continue Augmentin for 4 more days  Signed: Marzetta Lanza,STEVEN R 10/20/2015, 7:47 AM

## 2015-10-20 NOTE — Progress Notes (Signed)
[facility] PROGRESS NOTE  Norton Bivins NWG:[id number] DOB: Feb 21, 1945 DOA: 10/14/2015  PCP: Elijio Miles, MD  Brief History/Interval Summary: 70 year old Caucasian male with a past medical history of hyperlipidemia, HTN, carotid artery disease, GERD, depression, diabetes, CKD III followed by nephrology at [facility]. Patient underwent left knee arthroplasty on 9/22. Hospital course was complicated by transient loss of consciousness with perhaps some right-sided weakness. Code stroke was called. Patient was seen by neurology. Patient underwent MRI which did not reveal any stroke. His symptoms were thought to be due to orthostatic hypotension. Patient was monitored on the floor. Medicine was consulted last night due to fever and persistent confusion.  Reason for Visit: Acute encephalopathy and pneumonia  Antibiotics: Augmentin initiated 9/26  Subjective/Interval History: Awake, alert and oriented 3  ROS: Denies any nausea, vomiting.  Objective:  Vital Signs  Vitals:   10/19/15 0815 10/19/15 1300 10/19/15 2100 10/20/15 0531  BP: (!) 112/58 111/65 135/63 126/71  Pulse: 73 83 81 81  Resp:    20  Temp:  98.6 F (37 C) 99.7 F (37.6 C) 98.6 F (37 C)  TempSrc:  Oral Oral Oral  SpO2: 95% 96% 99% 95%  Weight:        Intake/Output Summary (Last 24 hours) at 10/20/15 0955 Last data filed at 10/20/15 0532  Gross per 24 hour  Intake                0 ml  Output             1000 ml  Net            -1000 ml   Filed Weights   10/14/15 1147  Weight: 125.6 kg (277 lb)    General appearance: alert, cooperative, appears stated age and no distress Resp: Diminished entry at the bases. No definite crackles or wheezing. Cardio: regular rate and rhythm, S1, S2 normal, no murmur, click, rub or gallop GI: soft, non-tender; bowel sounds normal; no masses,  no organomegaly Extremities: Left lower extremity in an immobilizer. Neurologic: Awake and alert. Oriented to person, place and  year or month. He did get the date incorrect. No cranial nerve deficits. No motor strength deficits.  Lab Results:  Data Reviewed: I have personally reviewed following labs and imaging studies  CBC:  Recent Labs Lab 10/14/15 1825 10/15/15 0348 10/16/15 0228 10/17/15 0503 10/18/15 0304  WBC 13.9* 13.6* 19.0* 13.9* 14.1*  NEUTROABS  --   --   --   --  10.3*  HGB 14.0 13.9 12.2* 11.5* 11.2*  HCT 42.7 43.0 38.7* 34.8* 34.5*  MCV 94.3 95.1 94.9 92.1 92.5  PLT 205 219 208 185 219    Basic Metabolic Panel:  Recent Labs Lab 10/14/15 1825 10/15/15 0348 10/18/15 0304  NA  --  134* 132*  K  --  4.5 3.9  CL  --  101 101  CO2  --  24 23  GLUCOSE  --  174* 171*  BUN  --  19 17  CREATININE 1.44* 1.53* 1.45*  CALCIUM  --  9.0 8.9    GFR: Estimated Creatinine Clearance: 64.9 mL/min (by C-G formula based on SCr of 1.45 mg/dL (H)).  Liver Function Tests:  Recent Labs Lab 10/18/15 0304  AST 68*  ALT 29  ALKPHOS 95  BILITOT 0.7  PROT 5.9*  ALBUMIN 2.3*     Recent Labs Lab 10/17/15 2209  AMMONIA 16    Coagulation Profile:  Recent Labs Lab 10/16/15 0228 10/17/15  0503 10/18/15 0304 10/19/15 0538 10/20/15 0301  INR 1.28 1.16 1.45 1.64 2.02    Cardiac Enzymes:  Recent Labs Lab 10/17/15 2209  TROPONINI <0.03    HbA1C: No results for input(s): HGBA1C in the last 72 hours.  CBG:  Recent Labs Lab 10/18/15 2148 10/19/15 0648 10/19/15 1133 10/19/15 1617 10/19/15 2111  GLUCAP 221* 170* 198* 230* 236*    Lipid Profile: No results for input(s): CHOL, HDL, LDLCALC, TRIG, CHOLHDL, LDLDIRECT in the last 72 hours.  Radiology Studies: No results found.   Medications:  Scheduled: . acetaminophen  1,000 mg Oral BID  . amoxicillin-clavulanate  1 tablet Oral Q12H  . aspirin EC  81 mg Oral QHS  . atorvastatin  80 mg Oral Daily  . Besifloxacin HCl  1 drop Right Eye TID  . Bromfenac Sodium  1 drop Ophthalmic QHS  . carvedilol  6.25 mg Oral BID WC  .  cholecalciferol  2,000 Units Oral Daily  . coumadin book   Does not apply Once  . Difluprednate  1 drop Left Eye 2 times per day  . docusate sodium  100 mg Oral BID  . enoxaparin (LOVENOX) injection  30 mg Subcutaneous Q12H  . ferrous sulfate  325 mg Oral TID PC  . insulin aspart  0-20 Units Subcutaneous TID WC  . insulin aspart  0-5 Units Subcutaneous QHS  . insulin aspart  6 Units Subcutaneous TID WC  . loratadine  10 mg Oral Daily  . lurasidone  40 mg Oral QHS  . omega-3 acid ethyl esters  2 g Oral BID  . pantoprazole  80 mg Oral Daily  . venlafaxine XR  150 mg Oral BID  . Warfarin - Pharmacist Dosing Inpatient   Does not apply q1800   Continuous: . lactated ringers 10 mL/hr at 10/14/15 1157   ZOX:[id number]:acetaminophen **OR** acetaminophen, bisacodyl, menthol-cetylpyridinium **OR** phenol, metoCLOPramide **OR** metoCLOPramide (REGLAN) injection, ondansetron **OR** ondansetron (ZOFRAN) IV, oxyCODONE-acetaminophen, polyethylene glycol  Assessment/Plan:  Active Problems:   H/O total knee replacement   Essential hypertension   Acute encephalopathy   DM (diabetes mellitus), type 2 with renal complications (HCC)   CKD (chronic kidney disease), stage III   Pneumonia   Acute encephalopathy resolved, likely secondary to pneumonia which is improving. Continue Augmentin for 5 more days. Okay to discharge home IM standpoint.  DVT Prophylaxis: Warfarin was initiated by ortho for DVT prophylaxis.    Code Status: Full code  Family Communication: Discussed with the patient.      LOS: 6 days   [facility] IncELMAHI,Dalisa Forrer A  [facility] Pager [phone] 10/20/2015, 9:55 AM  If 7PM-7AM, please contact night-coverage at [email or web address], password [email or web address]

## 2015-10-20 NOTE — Progress Notes (Signed)
Occupational Therapy Treatment Patient Details Name: Devon Williams MRN: [id number] DOB: [birth year] Today's Date: 10/20/2015    History of present illness Patient admitted for elective TKR on left.  PMH significant for DM, chronic kidney disease   OT comments  Pt remains below cognitive baseline but attention, orientation, and arousal are improving. Pt smiling and joking at times and able to follow one-step directions with increased time. Pt demonstrates improved ability to initiate LB dressing tasks but continues to require maximum assistance to complete. Pt able to maintain sitting EOB with no UE support for approximately 10 minutes to brush teeth and wash his face with min guard assistance and set-up. Pt would benefit from continued OT services at SNF post-acute D/C to improve safety and independence with ADL. Will continue to follow acutely.  Follow Up Recommendations  SNF    Equipment Recommendations  Other (comment) (Defer to subsequent venue)    Recommendations for Other Services      Precautions / Restrictions Precautions Precautions: Knee;Fall Precaution Booklet Issued: No Restrictions Weight Bearing Restrictions: Yes LLE Weight Bearing: Weight bearing as tolerated       Mobility Bed Mobility Overal bed mobility: Needs Assistance;+2 for physical assistance Bed Mobility: Supine to Sit;Rolling;Sit to Supine Rolling: Mod assist;+2 for physical assistance Sidelying to sit: +2 for physical assistance;Mod assist;HOB elevated Supine to sit: Mod assist;+2 for physical assistance;HOB elevated     General bed mobility comments: VC's for sequencing. Increased pain when L leg lowered from bed.  Transfers                      Balance Overall balance assessment: Needs assistance Sitting-balance support: No upper extremity supported;Feet supported Sitting balance-Leahy Scale: Poor Sitting balance - Comments: Able to sit with no UE support for grooming task at EOB with min  guard assistance to maintain balance.                           ADL Overall ADL's : Needs assistance/impaired     Grooming: Oral care;Wash/dry face;Min guard;Sitting Grooming Details (indicate cue type and reason): Min guard for maintaining balance at EOB                               General ADL Comments: Pt with improved dynamic sitting balance to reach R sock to initiate doffing but unable to complete; pain limited ability with L sock. Pt completed grooming tasks at EOB this session with min guard assistance to maintain balance.       Vision                     Perception     Praxis      Cognition   Behavior During Therapy: Flat affect Overall Cognitive Status: Impaired/Different from baseline Area of Impairment: Attention;Memory;Following commands;Safety/judgement;Awareness;Problem solving   Current Attention Level: Selective Memory: Decreased short-term memory  Following Commands: Follows one step commands with increased time;Follows multi-step commands inconsistently Safety/Judgement: Decreased awareness of safety Awareness: Emergent Problem Solving: Slow processing;Decreased initiation;Difficulty sequencing;Requires verbal cues;Requires tactile cues General Comments: Pt oriented but continues to require increased time and cues for following both one-step and multistep directions. Improving awareness and personality is coming back per wife but pt remains confused.    Extremity/Trunk Assessment               Exercises General Exercises - Upper Extremity  Elbow Extension: 10 reps;Strengthening (presses into bed in upright position)   Shoulder Instructions       General Comments      Pertinent Vitals/ Pain       Pain Assessment: 0-10 Pain Score: 3  Pain Location: L knee Pain Descriptors / Indicators: Grimacing;Operative site guarding;Moaning;Sore Pain Intervention(s): Monitored during session;Repositioned  Home Living                                           Prior Functioning/Environment              Frequency  Min 2X/week        Progress Toward Goals  OT Goals(current goals can now be found in the care plan section)  Progress towards OT goals: Progressing toward goals  Acute Rehab OT Goals Patient Stated Goal: Per wife for pt to fully recover. OT Goal Formulation: With patient/family Time For Goal Achievement: 11/01/15 Potential to Achieve Goals: Fair ADL Goals Pt Will Perform Grooming: with supervision;with set-up;bed level Pt Will Perform Upper Body Bathing: with min assist;sitting Pt Will Perform Lower Body Bathing: with mod assist;sit to/from stand Pt Will Perform Upper Body Dressing: sitting;with min assist Pt Will Perform Lower Body Dressing: with mod assist;sit to/from stand Pt Will Transfer to Toilet: bedside commode;with mod assist Pt Will Perform Toileting - Clothing Manipulation and hygiene: with mod assist;sit to/from stand  Plan Discharge plan remains appropriate    Co-evaluation                 End of Session     Activity Tolerance Patient tolerated treatment well   Patient Left in bed;with call bell/phone within reach;with family/visitor present   Nurse Communication          Time: [id number] OT Time Calculation (min): 28 min  Charges: OT General Charges $OT Visit: 1 Procedure OT Treatments $Self Care/Home Management : 23-37 mins  Doristine SectionCharity A Yahmir Sokolov, OTR/L [phone] 10/20/2015, 3:58 PM

## 2015-10-20 NOTE — Progress Notes (Signed)
Physical Therapy Treatment Patient Details Name: Devon Williams MRN: [id number] DOB: 01/28/1945 Today's Date: 10/20/2015    History of Present Illness Patient admitted for elective TKR on left.  PMH significant for DM, chronic kidney disease    PT Comments    Pt continues to be drowsy, but improving arousal each day.  Pt even able to initiate conversation at times.  Pt able to increase ambulation distance today, but decreased ROM noted.  Pt with increased pain in knee limiting ROM, but pt refusing pain meds due to having been lethargic in the days after surgery.  Discussed even trying Tylenol to A with pain management.  Will continue to follow.    Follow Up Recommendations  SNF     Equipment Recommendations  Rolling walker with 5" wheels    Recommendations for Other Services       Precautions / Restrictions Precautions Precautions: Knee;Fall Required Braces or Orthoses: Knee Immobilizer - Left Knee Immobilizer - Left: Discontinue once straight leg raise with < 10 degree lag Restrictions Weight Bearing Restrictions: Yes LLE Weight Bearing: Weight bearing as tolerated    Mobility  Bed Mobility Overal bed mobility: Needs Assistance;+2 for physical assistance Bed Mobility: Supine to Sit     Supine to sit: Mod assist;+2 for physical assistance;HOB elevated     General bed mobility comments: cues for sequencing getting out on R side of bed as previous sessions we had gotten up on L side of bed.  A with L LE, trunk, and scooting hips towards EOB.    Transfers Overall transfer level: Needs assistance Equipment used: Rolling walker (2 wheeled) Transfers: Sit to/from Stand Sit to Stand: Max assist;+2 physical assistance         General transfer comment: Max A to stand from bed with single UE on bed rail.  When given time pt is able to place UEs correctly and position LEs.    Ambulation/Gait Ambulation/Gait assistance: Min assist;+2 safety/equipment Ambulation Distance (Feet):  25 Feet Assistive device: Rolling walker (2 wheeled) Gait Pattern/deviations: Step-to pattern;Decreased step length - right;Decreased stance time - left;Trunk flexed     General Gait Details: pt able to increase ambulation distance today.  pt continues to need cues for more upright posture and positioning within RW.  cueing for increased step length on L LE.  Close chair follow needed.   Stairs            Wheelchair Mobility    Modified Rankin (Stroke Patients Only)       Balance Overall balance assessment: Needs assistance Sitting-balance support: Bilateral upper extremity supported;Single extremity supported;Feet supported Sitting balance-Leahy Scale: Poor     Standing balance support: Bilateral upper extremity supported;During functional activity Standing balance-Leahy Scale: Poor                      Cognition Arousal/Alertness:  (Drowsy, but arousable) Behavior During Therapy: Flat affect Overall Cognitive Status: Impaired/Different from baseline Area of Impairment: Attention;Memory;Following commands;Safety/judgement;Awareness;Problem solving   Current Attention Level: Selective Memory: Decreased short-term memory Following Commands: Follows one step commands with increased time;Follows multi-step commands inconsistently Safety/Judgement: Decreased awareness of safety;Decreased awareness of deficits Awareness: Emergent Problem Solving: Slow processing;Decreased initiation;Difficulty sequencing;Requires verbal cues;Requires tactile cues General Comments: pt continues to be oriented, but was unable to recall what he ate for breakfast.  Still somewhat drowsy, but improving.      Exercises Total Joint Exercises Long Arc Quad: Barbaraann BoysAAROM;Left;15 reps Knee Flexion: AAROM;Left;15 reps Goniometric ROM: AAROM ~10 - 35  General Comments        Pertinent Vitals/Pain Pain Assessment: 0-10 Pain Score: 6  Pain Location: L knee during ROM Pain Descriptors /  Indicators: Grimacing;Guarding;Sore Pain Intervention(s): Monitored during session;Repositioned (pt refusing pain meds)    Home Living                      Prior Function            PT Goals (current goals can now be found in the care plan section) Acute Rehab PT Goals Patient Stated Goal: Per wife for pt to fully recover. PT Goal Formulation: With patient/family Time For Goal Achievement: 10/22/15 Potential to Achieve Goals: Good Progress towards PT goals: Progressing toward goals    Frequency    7X/week      PT Plan Current plan remains appropriate    Co-evaluation             End of Session Equipment Utilized During Treatment: Gait belt;Left knee immobilizer Activity Tolerance: Patient limited by fatigue Patient left: in chair;with call bell/phone within reach;with family/visitor present     Time: 2130-8657 PT Time Calculation (min) (ACUTE ONLY): 29 min  Charges:  $Gait Training: 8-22 mins $Therapeutic Exercise: 8-22 mins                    G CodesSunny Williams, Devon Williams [phone] 10/20/2015, 9:27 AM

## 2015-10-20 NOTE — Progress Notes (Signed)
Physical Therapy Treatment Patient Details Name: Devon Williams MRN: [id number] DOB: 1945-11-12 Today's Date: 10/20/2015    History of Present Illness Patient admitted for elective TKR on left.  PMH significant for DM, chronic kidney disease    PT Comments    Worked on therex this session. Pt continues to be limited by confusion and pain when attempting knee flexion exercises. Current plan remains appropriate.   Follow Up Recommendations  SNF     Equipment Recommendations  Rolling walker with 5" wheels    Recommendations for Other Services       Precautions / Restrictions Precautions Precautions: Knee;Fall Precaution Booklet Issued: No Restrictions Weight Bearing Restrictions: Yes LLE Weight Bearing: Weight bearing as tolerated    Mobility  Bed Mobility Overal bed mobility: Needs Assistance;+2 for physical assistance Bed Mobility: Supine to Sit;Rolling;Sit to Supine Rolling: Mod assist;+2 for physical assistance Sidelying to sit: +2 for physical assistance;Mod assist;HOB elevated Supine to sit: Mod assist;+2 for physical assistance;HOB elevated     General bed mobility comments: not assessed  Transfers                    Ambulation/Gait                 Stairs            Wheelchair Mobility    Modified Rankin (Stroke Patients Only)       Balance Overall balance assessment: Needs assistance Sitting-balance support: No upper extremity supported;Feet supported Sitting balance-Leahy Scale: Poor Sitting balance - Comments: Able to sit with no UE support for grooming task at EOB with min guard assistance to maintain balance.                            Cognition Arousal/Alertness: Lethargic (Drowsy, but arousable) Behavior During Therapy: Flat affect Overall Cognitive Status: Impaired/Different from baseline Area of Impairment: Following commands   Current Attention Level: Selective Memory: Decreased short-term memory Following  Commands: Follows one step commands with increased time;Follows multi-step commands inconsistently Safety/Judgement: Decreased awareness of safety Awareness: Emergent Problem Solving: Slow processing;Decreased initiation;Difficulty sequencing;Requires verbal cues;Requires tactile cues General Comments: Pt oriented but continues to require increased time and cues for following both one-step and multistep directions. Improving awareness and personality is coming back per wife but pt remains confused.    Exercises Total Joint Exercises Ankle Circles/Pumps: AROM;Both;10 reps;Supine Quad Sets: AAROM;Both;15 reps;Supine Short Arc Quad: AAROM;Left;10 reps;Supine Heel Slides:  (attempted but pt too confused and resisted with knee ext) Hip ABduction/ADduction: AAROM;Left;10 reps;Supine General Exercises - Upper Extremity Elbow Extension: 10 reps;Strengthening (presses into bed in upright position)    General Comments        Pertinent Vitals/Pain Pain Assessment: Faces Pain Score: 3  Faces Pain Scale: Hurts even more Pain Location: L knee Pain Descriptors / Indicators: Grimacing;Guarding;Sore Pain Intervention(s): Limited activity within patient's tolerance;Monitored during session    Home Living                      Prior Function            PT Goals (current goals can now be found in the care plan section) Acute Rehab PT Goals Patient Stated Goal: none stated PT Goal Formulation: With patient/family Time For Goal Achievement: 10/22/15 Potential to Achieve Goals: Good Progress towards PT goals: Not progressing toward goals - comment (confusion about therex and with limited knee ROM)    Frequency  7X/week      PT Plan Current plan remains appropriate    Co-evaluation             End of Session Equipment Utilized During Treatment: Gait belt Activity Tolerance: Patient limited by pain Patient left: with call bell/phone within reach;in bed;with bed alarm  set     Time: 1559-1620 PT Time Calculation (min) (ACUTE ONLY): 21 min  Charges:  $Therapeutic Exercise: 8-22 mins                    G Codes:      Derek MoundKellyn R Shenouda Genova Zuria Fosdick, PTA Pager: [phone]   10/20/2015, 4:32 PM

## 2015-10-20 NOTE — Progress Notes (Addendum)
ANTICOAGULATION CONSULT NOTE - Follow Up Consult  Pharmacy Consult for Coumadin Indication: VTE prophylaxis  Allergies  Allergen Reactions  . Nabumetone Other (See Comments)    UNSPECIFIED REACTION     Patient Measurements: Weight: 277 lb (125.6 kg)  Vital Signs: Temp: 98.6 F (37 C) (09/28 0531) Temp Source: Oral (09/28 0531) BP: 126/71 (09/28 0531) Pulse Rate: 81 (09/28 0531)  Labs:  Recent Labs  10/17/15 2209 10/18/15 0304 10/19/15 0538 10/20/15 0301  HGB  --  11.2*  --   --   HCT  --  34.5*  --   --   PLT  --  219  --   --   LABPROT  --  17.7* 19.6* 23.2*  INR  --  1.45 1.64 2.02  CREATININE  --  1.45*  --   --   TROPONINI <0.03  --   --   --     Estimated Creatinine Clearance: 64.9 mL/min (by C-G formula based on SCr of 1.45 mg/dL (H)).   Medications:  Scheduled:  . acetaminophen  1,000 mg Oral BID  . amoxicillin-clavulanate  1 tablet Oral Q12H  . aspirin EC  81 mg Oral QHS  . atorvastatin  80 mg Oral Daily  . Besifloxacin HCl  1 drop Right Eye TID  . Bromfenac Sodium  1 drop Ophthalmic QHS  . carvedilol  6.25 mg Oral BID WC  . cholecalciferol  2,000 Units Oral Daily  . coumadin book   Does not apply Once  . Difluprednate  1 drop Left Eye 2 times per day  . docusate sodium  100 mg Oral BID  . enoxaparin (LOVENOX) injection  30 mg Subcutaneous Q12H  . ferrous sulfate  325 mg Oral TID PC  . insulin aspart  0-20 Units Subcutaneous TID WC  . insulin aspart  0-5 Units Subcutaneous QHS  . insulin aspart  6 Units Subcutaneous TID WC  . loratadine  10 mg Oral Daily  . lurasidone  40 mg Oral QHS  . omega-3 acid ethyl esters  2 g Oral BID  . pantoprazole  80 mg Oral Daily  . venlafaxine XR  150 mg Oral BID  . warfarin  5 mg Oral ONCE-1800  . Warfarin - Pharmacist Dosing Inpatient   Does not apply q1800    Assessment: [age] YOM s/p left TKA on Coumadin/Lovenox for VTE px s/p left TKA on 9/22, no anticoagulation PTA.  INR with better increase over last 2  days.  No bleeding noted.  INR: 2.02  Goal of Therapy:  INR 2-3 Monitor platelets by anticoagulation protocol: Yes   Plan: Day 4 of Coumadin therapy  INR therapeutic at 2.02. Will give warfarin 6mg  tonight  Will D/C Lovenox now that INR >1.8 F/U INR   Cher NakaiSheema Mikah Poss, PharmD Clinical Pharmacist 10/20/2015 1:49 PM   [facility]

## 2015-10-20 NOTE — Progress Notes (Signed)
Neurology Note  Still waiting for the final read on the CUS but preliminary report indicates no hemodynamically significant stenosis. No additional recommendations. Continue aspirin and statin. Fine for d/c from neurology perspective.

## 2015-10-21 LAB — GLUCOSE, CAPILLARY
GLUCOSE-CAPILLARY: 170 mg/dL — AB (ref 65–99)
GLUCOSE-CAPILLARY: 238 mg/dL — AB (ref 65–99)
GLUCOSE-CAPILLARY: 255 mg/dL — AB (ref 65–99)
GLUCOSE-CAPILLARY: 202 mg/dL — AB (ref 65–99)

## 2015-10-21 LAB — CREATININE, SERUM
CREATININE: 1.3 mg/dL — AB (ref 0.61–1.24)
GFR calc Af Amer: >60 mL/min (ref >60)
GFR calc non Af Amer: 54 mL/min — ABNORMAL LOW (ref >60)

## 2015-10-21 LAB — PROTIME-INR
INR: 2.02
PROTHROMBIN TIME: 23.1 s — AB (ref 11.4–15.2)

## 2015-10-21 MED ORDER — WARFARIN SODIUM 7.5 MG PO TABS
7.5000 mg | ORAL_TABLET | Freq: Once | ORAL | Status: AC
  Administered 2015-10-21: 7.5 mg via ORAL
  Filled 2015-10-21: qty 1

## 2015-10-21 NOTE — Progress Notes (Signed)
   [facility] PROGRESS NOTE  Devon SangerFred Boese ZOX:[id number] DOB: 10/11/1945 DOA: 10/14/2015  PCP: Elijio MilesWEAVER,JOHN W, MD  Brief History/Interval Summary: 70 year old Caucasian male with a past medical history of hyperlipidemia, HTN, carotid artery disease, GERD, depression, diabetes, CKD III [facility] Patient underwent left knee arthroplasty on 9/22. Hospital course was complicated by transient loss of consciousness with perhaps some right-sided weakness. Code stroke was called. Patient was seen by neurology. Patient underwent MRI which did not reveal any stroke. His symptoms were thought to be due to orthostatic hypotension. Patient was monitored on the floor. Medicine was consulted last night due to fever and persistent confusion.  Reason for Visit: Acute encephalopathy and pneumonia  Subjective/Interval History: Awake, alert and oriented 3  ROS: Denies any nausea, vomiting.  Objective:  Vital Signs  Vitals:   10/20/15 0531 10/20/15 1400 10/20/15 2044 10/21/15 0504  BP: 126/71 (!) 145/74 125/66 125/65  Pulse: 81 83 79 72  Resp: 20 18 16 16   Temp: 98.6 F (37 C) 98.4 F (36.9 C) 98.6 F (37 C) 98.9 F (37.2 C)  TempSrc: Oral Oral Oral Oral  SpO2: 95% 98% 97% 94%  Weight:       General appearance: alert, cooperative, appears stated age and no distress Resp: Diminished entry at the bases. No definite crackles or wheezing. Cardio: regular rate and rhythm, S1, S2 normal, no murmur, click, rub or gallop GI: soft, non-tender; bowel sounds normal; no masses,  no organomegaly Extremities: Left lower extremity in an immobilizer. Neurologic: Awake and alert. Oriented to person, place and year or month. He did get the date incorrect. No cranial nerve deficits. No motor strength deficits.  Assessment/Plan:  Active Problems:   H/O total knee replacement   Essential hypertension   Acute encephalopathy   DM (diabetes mellitus), type 2 with renal complications  (HCC)   CKD (chronic kidney disease), stage III   Pneumonia   Acute encephalopathy resolved, likely secondary to pneumonia which is improving. Continue Augmentin for 5 more days. From medical standpoint (acute encephalopathy and pneumonia) patient is ready for discharge and stable for transfer. Patient is on room air, does not have any increased or labored breathing. I think he can tolerate the ambulance ride for the transfer to rehabilitation facilities.  I will sign off, please let me know if I can help.  DVT Prophylaxis: Warfarin was initiated by ortho for DVT prophylaxis.    Code Status: Full code  Family Communication: Discussed with the patient.      LOS: 7 days   [facility] LLCELMAHI,Matilde Pottenger A  [facility] Pager [phone] 10/21/2015, 12:12 PM  If 7PM-7AM, please contact night-coverage at [email or web address], password [email or web address]

## 2015-10-21 NOTE — Progress Notes (Signed)
ANTICOAGULATION CONSULT NOTE - Follow Up Consult  Pharmacy Consult for Coumadin Indication: VTE prophylaxis  Allergies  Allergen Reactions  . Nabumetone Other (See Comments)    UNSPECIFIED REACTION     Patient Measurements: Weight: 277 lb (125.6 kg)  Vital Signs: Temp: 99 F (37.2 C) (09/29 1259) Temp Source: Oral (09/29 1259) BP: 141/80 (09/29 1259) Pulse Rate: 68 (09/29 1259)  Labs:  Recent Labs  10/19/15 0538 10/20/15 0301 10/21/15 0515  LABPROT 19.6* 23.2* 23.1*  INR 1.64 2.02 2.02  CREATININE  --   --  1.30*    Estimated Creatinine Clearance: 72.4 mL/min (by C-G formula based on SCr of 1.3 mg/dL (H)).   Medications:  Scheduled:  . acetaminophen  1,000 mg Oral BID  . amoxicillin-clavulanate  1 tablet Oral Q12H  . aspirin EC  81 mg Oral QHS  . atorvastatin  80 mg Oral Daily  . Besifloxacin HCl  1 drop Right Eye TID  . Bromfenac Sodium  1 drop Ophthalmic QHS  . carvedilol  6.25 mg Oral BID WC  . cholecalciferol  2,000 Units Oral Daily  . coumadin book   Does not apply Once  . Difluprednate  1 drop Left Eye 2 times per day  . docusate sodium  100 mg Oral BID  . ferrous sulfate  325 mg Oral TID PC  . insulin aspart  0-20 Units Subcutaneous TID WC  . insulin aspart  0-5 Units Subcutaneous QHS  . insulin aspart  6 Units Subcutaneous TID WC  . loratadine  10 mg Oral Daily  . lurasidone  40 mg Oral QHS  . omega-3 acid ethyl esters  2 g Oral BID  . pantoprazole  80 mg Oral Daily  . venlafaxine XR  150 mg Oral BID  . 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  INR therapeutic x 2 days.  No bleeding noted.  INR: 2.02  Goal of Therapy:  INR 2-3 Monitor platelets by anticoagulation protocol: Yes   Plan: Will give warfarin 7.5 mg tonight  F/U daily INR   Toys 'R' UsKimberly Fredia Chittenden, Pharm.D., BCPS Clinical Pharmacist Pager  [phone] 10/21/2015 1:35 PM

## 2015-10-21 NOTE — Progress Notes (Signed)
Orthopedic Tech Progress Note Patient Details:  Devon SangerFred Williams 01/19/1946 [id number]  Patient ID: Devon SangerFred Williams, male   DOB: 11/05/1945, 70 y.o.   MRN: [id number] Applied cpm 0-55  [name], [name] 10/21/2015, 6:04 AM

## 2015-10-21 NOTE — Progress Notes (Signed)
Physical Therapy Treatment Patient Details Name: Devon SangerFred Williams MRN: [id number] DOB: 06/06/1945 Today's Date: 10/21/2015    History of Present Illness Patient admitted for elective TKR on left.  PMH significant for DM, chronic kidney disease    PT Comments    Pt performed increased gait.  Remains slow to follow commands.  Will continue to require rehab in post acute setting to improve strength before return home.    Follow Up Recommendations  SNF     Equipment Recommendations  Rolling walker with 5" wheels    Recommendations for Other Services       Precautions / Restrictions Precautions Precautions: Knee;Fall Precaution Booklet Issued: No Required Braces or Orthoses: Knee Immobilizer - Left Knee Immobilizer - Left: Discontinue once straight leg raise with < 10 degree lag Restrictions Weight Bearing Restrictions: Yes LLE Weight Bearing: Weight bearing as tolerated    Mobility  Bed Mobility Overal bed mobility: Needs Assistance;+2 for physical assistance       Supine to sit: Mod assist;+2 for physical assistance     General bed mobility comments: Pt required increased assist to sit edge of bed.  Pt required assist with LLE/upper trunk and cues for hand placement.    Transfers Overall transfer level: Needs assistance Equipment used: Rolling walker (2 wheeled) Transfers: Sit to/from Stand Sit to Stand: Mod assist         General transfer comment: Cues for hand placement to push from seated surface.  Pt is slow to ascend but required cues for erect stance including knee and hip extension.    Ambulation/Gait Ambulation/Gait assistance: Min assist;+2 safety/equipment Ambulation Distance (Feet): 60 Feet Assistive device: Rolling walker (2 wheeled)   Gait velocity: decreased   General Gait Details: Pt required cues for weight shifting L and R foot clearance.  Pt has poor foot clearance on R initially that improves as gait progressed.     Stairs             Wheelchair Mobility    Modified Rankin (Stroke Patients Only)       Balance Overall balance assessment: Needs assistance   Sitting balance-Leahy Scale: Poor       Standing balance-Leahy Scale: Poor                      Cognition Arousal/Alertness: Awake/alert Behavior During Therapy: WFL for tasks assessed/performed;Flat affect Overall Cognitive Status: Within Functional Limits for tasks assessed         Following Commands: Follows one step commands with increased time;Follows multi-step commands inconsistently            Exercises      General Comments        Pertinent Vitals/Pain Pain Assessment: 0-10 Pain Score: 5  Pain Location: L knee Pain Descriptors / Indicators: Aching;Grimacing;Guarding Pain Intervention(s): Monitored during session;Repositioned    Home Living                      Prior Function            PT Goals (current goals can now be found in the care plan section) Acute Rehab PT Goals Patient Stated Goal: none stated PT Goal Formulation: With patient/family Potential to Achieve Goals: Good Progress towards PT goals: Progressing toward goals    Frequency    7X/week      PT Plan Current plan remains appropriate    Co-evaluation  End of Session Equipment Utilized During Treatment: Gait belt Activity Tolerance: Patient limited by pain Patient left: with call bell/phone within reach;in bed;with bed alarm set     Time: 1610-9604 PT Time Calculation (min) (ACUTE ONLY): 23 min  Charges:  $Gait Training: 8-22 mins $Therapeutic Activity: 8-22 mins                    G Codes:      Florestine Avers 18-Nov-2015, 4:57 PM  Joycelyn Rua, PTA pager [phone]

## 2015-10-21 NOTE — Progress Notes (Signed)
Orthopedic Tech Progress Note Patient Details:  Devon SangerFred Williams 06/20/1945 [id number]  CPM Left Knee CPM Left Knee: On Left Knee Flexion (Degrees): 55 Left Knee Extension (Degrees): 0 Additional Comments: trapeze bar patient helper   Saul FordyceJennifer C [name] 10/21/2015, 4:27 PM

## 2015-10-21 NOTE — Progress Notes (Signed)
Physical Therapy Treatment Patient Details Name: Devon Williams MRN: [id number] DOB: 02-Oct-1945 Today's Date: 10/21/2015    History of Present Illness Patient admitted for elective TKR on left.  PMH significant for DM, chronic kidney disease    PT Comments    Pt remains to require +2 assist for functional mobility.  Knee flexion improved during session with AAROM.  Pt refused pain meds after session.  Ice applied.  Will follow up in pm to advance mobility.    Follow Up Recommendations  SNF     Equipment Recommendations  Rolling walker with 5" wheels    Recommendations for Other Services       Precautions / Restrictions Precautions Precautions: Knee;Fall Precaution Booklet Issued: No Required Braces or Orthoses: Knee Immobilizer - Left Knee Immobilizer - Left: Discontinue once straight leg raise with < 10 degree lag Restrictions Weight Bearing Restrictions: Yes LLE Weight Bearing: Weight bearing as tolerated    Mobility  Bed Mobility Overal bed mobility: Needs Assistance;+2 for physical assistance       Supine to sit: Mod assist;+2 for physical assistance     General bed mobility comments: Pt required increased assist to sit edge of bed.  Pt required assist with LLE/upper trunk and cues for hand placement.    Transfers Overall transfer level: Needs assistance Equipment used: Rolling walker (2 wheeled)   Sit to Stand: Mod assist;+2 physical assistance         General transfer comment: Cues for hand placement to push from seated surface.  Pt is slow to ascend but required cues for erect stance including knee and hip extension.    Ambulation/Gait Ambulation/Gait assistance: Mod assist;+2 safety/equipment Ambulation Distance (Feet): 35 Feet Assistive device: Rolling walker (2 wheeled) Gait Pattern/deviations: Step-to pattern;Decreased stride length;Shuffle;Trunk flexed;Antalgic Gait velocity: decreased   General Gait Details: Pt required cues for weight shifting L  and R foot clearance.  Pt has poor foot clearance on R initially that improves as gait progressed.     Stairs            Wheelchair Mobility    Modified Rankin (Stroke Patients Only)       Balance Overall balance assessment: Needs assistance   Sitting balance-Leahy Scale: Poor       Standing balance-Leahy Scale: Poor                      Cognition Arousal/Alertness: Awake/alert Behavior During Therapy: WFL for tasks assessed/performed;Flat affect Overall Cognitive Status: Within Functional Limits for tasks assessed                      Exercises Total Joint Exercises Ankle Circles/Pumps: AROM;Both;10 reps;Supine Quad Sets: AROM;Left;10 reps;Supine Heel Slides: PROM;AAROM;Left;10 reps;Supine Hip ABduction/ADduction: AAROM;Left;10 reps;Supine Straight Leg Raises: AAROM;Left;10 reps;Supine Goniometric ROM: 67 degrees flexion L knee    General Comments        Pertinent Vitals/Pain Pain Assessment: Faces Faces Pain Scale: Hurts little more Pain Location: L knee Pain Descriptors / Indicators: Aching;Guarding;Grimacing Pain Intervention(s): Monitored during session;Repositioned;Ice applied    Home Living                      Prior Function            PT Goals (current goals can now be found in the care plan section) Acute Rehab PT Goals Patient Stated Goal: none stated Potential to Achieve Goals: Good Progress towards PT goals: Progressing toward goals  Frequency    7X/week      PT Plan Current plan remains appropriate    Co-evaluation             End of Session Equipment Utilized During Treatment: Gait belt Activity Tolerance: Patient limited by pain Patient left: with call bell/phone within reach;in bed;with bed alarm set     Time: [id number] PT Time Calculation (min) (ACUTE ONLY): 35 min  Charges:  $Gait Training: 8-22 mins $Therapeutic Exercise: 8-22 mins                    G Codes:      Florestine Aversimee J  Payden Docter 10/21/2015, 9:41 AM  Joycelyn RuaAimee Kiandra Sanguinetti, PTA pager [phone]

## 2015-10-21 NOTE — Care Management Note (Signed)
Case Management Note  Patient Details  Name: Devon Williams MRN: [id number] Date of Birth: 06/23/1945  Subjective/Objective:  Left TKA                   Action/Plan: Discharge Planning: Chart reviewed. Pt scheduled dc to SNF. [facility]. [facility]ing on [facility] for [facility].    Expected Discharge Date:                Expected Discharge Plan:  Skilled Nursing Facility  In-House Referral:  Clinical Social Work  Discharge planning Services  CM Consult  Post Acute Care Choice:  NA Choice offered to:  NA  DME Arranged:  N/A DME Agency:  NA  HH Arranged:  NA HH Agency:  NA  Status of Service:  Completed, signed off  If discussed at Long Length of Stay Meetings, dates discussed:    Additional Comments:  Devon Williams, Devon Traynham Ellen, RN 10/21/2015, 5:15 PM

## 2015-10-21 NOTE — Clinical Social Work Note (Signed)
CSW sent updated clinicals to the [facility] for SNF determination (10am this morning).  Per [facility] yesterday 9/28, the patient was not medically stable for transfer.  CSW is awaiting a determination from the [facility] for placement.  Disposition: [facility] SN[facility] placement- determination has not yet been made by the [facility].  Vickii PennaGina Jahkai Yandell, MSW, LCSW  [phone]  Licensed Clinical Social Worker

## 2015-10-22 LAB — GLUCOSE, CAPILLARY
GLUCOSE-CAPILLARY: 168 mg/dL — AB (ref 65–99)
Glucose-Capillary: 243 mg/dL — ABNORMAL HIGH (ref 65–99)
Glucose-Capillary: 154 mg/dL — ABNORMAL HIGH (ref 65–99)

## 2015-10-22 LAB — PROTIME-INR
INR: 2.1
Prothrombin Time: 23.9 seconds — ABNORMAL HIGH (ref 11.4–15.2)

## 2015-10-22 MED ORDER — WARFARIN SODIUM 7.5 MG PO TABS
7.5000 mg | ORAL_TABLET | Freq: Once | ORAL | Status: AC
  Administered 2015-10-22: 7.5 mg via ORAL
  Filled 2015-10-22: qty 1

## 2015-10-22 NOTE — Progress Notes (Addendum)
Physical Therapy Treatment Patient Details Name: Devon SangerFred Williams MRN: [id number] DOB: 12/14/1945 Today's Date: 10/22/2015    History of Present Illness Patient admitted for elective TKR on left.  PMH significant for DM, chronic kidney disease    PT Comments    Pt presented sitting OOB in recliner when PT entered room. Pt limited during session secondary to lethargy, pain and c/o dizziness in standing and with ambulation. PT continuing to recommend pt d/c to SNF for further intensive therapy services.  Follow Up Recommendations  SNF     Equipment Recommendations  Rolling walker with 5" wheels    Recommendations for Other Services       Precautions / Restrictions Precautions Precautions: Knee;Fall Required Braces or Orthoses: Knee Immobilizer - Left Knee Immobilizer - Left: Discontinue once straight leg raise with < 10 degree lag Restrictions Weight Bearing Restrictions: Yes LLE Weight Bearing: Weight bearing as tolerated    Mobility  Bed Mobility               General bed mobility comments: pt sitting OOB in recliner when PT entered room  Transfers Overall transfer level: Needs assistance Equipment used: Rolling walker (2 wheeled) Transfers: Sit to/from Stand Sit to Stand: Mod assist         General transfer comment: pt required increased time and VC'ing for bilateral hand placement; pt very slow to achieve full erect standing position. Upon standing, pt stated he was dizzy and needed to stand for approximately one minute prior to initiating ambulation.  Ambulation/Gait Ambulation/Gait assistance: Min assist Ambulation Distance (Feet): 15 Feet Assistive device: Rolling walker (2 wheeled) Gait Pattern/deviations: Step-to pattern;Decreased step length - right;Decreased stance time - left;Decreased weight shift to left;Antalgic Gait velocity: decreased Gait velocity interpretation: Below normal speed for age/gender General Gait Details: pt stopping frequently  throughout ambulation with c/o of dizziness and light-headed.    Stairs            Wheelchair Mobility    Modified Rankin (Stroke Patients Only)       Balance Overall balance assessment: Needs assistance Sitting-balance support: Feet supported;Bilateral upper extremity supported Sitting balance-Leahy Scale: Poor     Standing balance support: During functional activity;Single extremity supported Standing balance-Leahy Scale: Poor Standing balance comment: pt able to stand with RW to use urinal to void. pt able to hold urinal and RW with one UE support without LOB                    Cognition Arousal/Alertness: Lethargic Behavior During Therapy: WFL for tasks assessed/performed;Flat affect Overall Cognitive Status: Within Functional Limits for tasks assessed Area of Impairment: Following commands   Current Attention Level: Selective                Exercises Total Joint Exercises Ankle Circles/Pumps: AROM;Both;10 reps;Seated Quad Sets: AROM;Left;10 reps;Seated Heel Slides: PROM;AAROM;Left;10 reps;Seated Goniometric ROM: Flexion = 52 degrees; Extension = lacking 15 degrees to neutral; measured in sitting    General Comments        Pertinent Vitals/Pain Pain Assessment: Faces Faces Pain Scale: Hurts a little bit Pain Location: L knee Pain Descriptors / Indicators: Sore;Guarding Pain Intervention(s): Monitored during session;Repositioned    Home Living                      Prior Function            PT Goals (current goals can now be found in the care plan section) Acute Rehab PT  Goals Patient Stated Goal: none stated PT Goal Formulation: With patient/family Time For Goal Achievement: 11/01/15 Potential to Achieve Goals: Good Progress towards PT goals: Progressing toward goals    Frequency    7X/week      PT Plan Current plan remains appropriate    Co-evaluation             End of Session Equipment Utilized During  Treatment: Gait belt;Left knee immobilizer Activity Tolerance: Patient limited by lethargy;Patient limited by pain Patient left: in chair;with call bell/phone within reach;with family/visitor present     Time: 0454-0981 PT Time Calculation (min) (ACUTE ONLY): 21 min  Charges:  $Gait Training: 8-22 mins                    G CodesAlessandra Williams Devon Williams 11/01/15, 11:17 AM Deborah Chalk, PT, DPT [phone]

## 2015-10-22 NOTE — Progress Notes (Addendum)
ANTICOAGULATION CONSULT NOTE - Follow Up Consult  Pharmacy Consult for Coumadin Indication: VTE prophylaxis  Allergies  Allergen Reactions  . Nabumetone Other (See Comments)    UNSPECIFIED REACTION     Patient Measurements: Weight: 277 lb (125.6 kg)  Vital Signs: Temp: 98.2 F (36.8 C) (09/30 0514) Temp Source: Oral (09/30 0514) BP: 121/67 (09/30 0514) Pulse Rate: 75 (09/30 0514)  Labs:  Recent Labs  10/20/15 0301 10/21/15 0515 10/22/15 0531  LABPROT 23.2* 23.1* 23.9*  INR 2.02 2.02 2.10  CREATININE  --  1.30*  --     Estimated Creatinine Clearance: 72.4 mL/min (by C-G formula based on SCr of 1.3 mg/dL (H)).   Medications:  Scheduled:  . acetaminophen  1,000 mg Oral BID  . amoxicillin-clavulanate  1 tablet Oral Q12H  . aspirin EC  81 mg Oral QHS  . atorvastatin  80 mg Oral Daily  . Besifloxacin HCl  1 drop Right Eye TID  . Bromfenac Sodium  1 drop Ophthalmic QHS  . carvedilol  6.25 mg Oral BID WC  . cholecalciferol  2,000 Units Oral Daily  . coumadin book   Does not apply Once  . Difluprednate  1 drop Left Eye 2 times per day  . docusate sodium  100 mg Oral BID  . ferrous sulfate  325 mg Oral TID PC  . insulin aspart  0-20 Units Subcutaneous TID WC  . insulin aspart  0-5 Units Subcutaneous QHS  . insulin aspart  6 Units Subcutaneous TID WC  . loratadine  10 mg Oral Daily  . lurasidone  40 mg Oral QHS  . omega-3 acid ethyl esters  2 g Oral BID  . pantoprazole  80 mg Oral Daily  . venlafaxine XR  150 mg Oral BID  . 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  INR therapeutic and now off lovenox bridging.  No bleeding noted. -INR 2.10 -No S/Sx bleeding noted  Goal of Therapy:  INR 2-3 Monitor platelets by anticoagulation protocol: Yes   Plan: -Will give warfarin 7.5 mg tonight  -F/U daily INR -F/u CBC tomorrow AM, last CBC  9/26  Devon KaufmanKai Lillyonna Armstead Bernette Redbird(Kenny), Devon Williams  PGY1 Pharmacy Resident Pager: [phone] 10/22/2015 8:39 AM

## 2015-10-22 NOTE — Progress Notes (Signed)
Orthopedics Progress Note  Subjective: Per his wife (patient sleeping) had a good day yesterday with some walking in the hall. Pain under good control  Objective:  Vitals:   10/22/15 0100 10/22/15 0514  BP: (!) 103/55 121/67  Pulse: 70 75  Resp:    Temp: 99.3 F (37.4 C) 98.2 F (36.8 C)    General: Awake and alert  Musculoskeletal: knee immobilizer in place Neurovascularly intact  Lab Results  Component Value Date   WBC 14.1 (H) 10/18/2015   HGB 11.2 (L) 10/18/2015   HCT 34.5 (L) 10/18/2015   MCV 92.5 10/18/2015   PLT 219 10/18/2015       Component Value Date/Time   NA 132 (L) 10/18/2015 0304   K 3.9 10/18/2015 0304   CL 101 10/18/2015 0304   CO2 23 10/18/2015 0304   GLUCOSE 171 (H) 10/18/2015 0304   BUN 17 10/18/2015 0304   CREATININE 1.30 (H) 10/21/2015 0515   CALCIUM 8.9 10/18/2015 0304   GFRNONAA 54 (L) 10/21/2015 0515   GFRAA >60 10/21/2015 0515    Lab Results  Component Value Date   INR 2.10 10/22/2015   INR 2.02 10/21/2015   INR 2.02 10/20/2015    Assessment/Plan: POD #7 s/p Procedure(s): LEFT TOTAL KNEE ARTHROPLASTY Pneumonia resolving clinically. Improved mobility with therapy - needs work on extension Pulmonary toilet and DVT prophylaxis Coumadin therapeutic now D/C planning - await [facility] for [facility]/SNF  Almedia BallsSteven R. Ranell PatrickNorris, MD 10/22/2015 6:55 AM

## 2015-10-22 NOTE — Progress Notes (Signed)
Orthopedic Tech Progress Note Patient Details:  Fatima SangerFred Kallal 03/13/1945 [id number]  Patient ID: Fatima SangerFred Litzenberger, male   DOB: 11/01/1945, 70 y.o.   MRN: [id number] Pt refused cpm. Will call when ready  [name], Synda Bagent J 10/22/2015, 6:09 AM

## 2015-10-23 LAB — CBC
HCT: 41.1 % (ref 39.0–52.0)
Hemoglobin: 13.4 g/dL (ref 13.0–17.0)
MCH: 30.4 pg (ref 26.0–34.0)
MCHC: 32.6 g/dL (ref 30.0–36.0)
MCV: 93.2 fL (ref 78.0–100.0)
Platelets: 421 10*3/uL — ABNORMAL HIGH (ref 150–400)
RBC: 4.41 MIL/uL (ref 4.22–5.81)
RDW: 14.1 % (ref 11.5–15.5)
WBC: 15.5 10*3/uL — ABNORMAL HIGH (ref 4.0–10.5)

## 2015-10-23 LAB — GLUCOSE, CAPILLARY
GLUCOSE-CAPILLARY: 170 mg/dL — AB (ref 65–99)
GLUCOSE-CAPILLARY: 215 mg/dL — AB (ref 65–99)
Glucose-Capillary: 162 mg/dL — ABNORMAL HIGH (ref 65–99)
Glucose-Capillary: 172 mg/dL — ABNORMAL HIGH (ref 65–99)

## 2015-10-23 LAB — PROTIME-INR
INR: 2.46
Prothrombin Time: 27.1 seconds — ABNORMAL HIGH (ref 11.4–15.2)

## 2015-10-23 MED ORDER — WARFARIN SODIUM 7.5 MG PO TABS
7.5000 mg | ORAL_TABLET | Freq: Once | ORAL | Status: AC
  Administered 2015-10-23: 7.5 mg via ORAL
  Filled 2015-10-23: qty 1

## 2015-10-23 NOTE — Progress Notes (Signed)
ANTICOAGULATION CONSULT NOTE - Follow Up Consult  Pharmacy Consult for Coumadin Indication: VTE prophylaxis  Allergies  Allergen Reactions  . Nabumetone Other (See Comments)    UNSPECIFIED REACTION     Patient Measurements: Weight: 277 lb (125.6 kg)  Vital Signs: Temp: 99.3 F (37.4 C) (09/30 2349) Temp Source: Oral (09/30 2349) BP: 119/61 (09/30 2349) Pulse Rate: 73 (09/30 2349)  Labs:  Recent Labs  10/21/15 0515 10/22/15 0531 10/23/15 0736  HGB  --   --  13.4  HCT  --   --  41.1  PLT  --   --  421*  LABPROT 23.1* 23.9* 27.1*  INR 2.02 2.10 2.46  CREATININE 1.30*  --   --     Estimated Creatinine Clearance: 72.4 mL/min (by C-G formula based on SCr of 1.3 mg/dL (H)).   Medications:  Scheduled:  . acetaminophen  1,000 mg Oral BID  . amoxicillin-clavulanate  1 tablet Oral Q12H  . aspirin EC  81 mg Oral QHS  . atorvastatin  80 mg Oral Daily  . Besifloxacin HCl  1 drop Right Eye TID  . Bromfenac Sodium  1 drop Ophthalmic QHS  . carvedilol  6.25 mg Oral BID WC  . cholecalciferol  2,000 Units Oral Daily  . coumadin book   Does not apply Once  . Difluprednate  1 drop Left Eye 2 times per day  . docusate sodium  100 mg Oral BID  . ferrous sulfate  325 mg Oral TID PC  . insulin aspart  0-20 Units Subcutaneous TID WC  . insulin aspart  0-5 Units Subcutaneous QHS  . insulin aspart  6 Units Subcutaneous TID WC  . loratadine  10 mg Oral Daily  . lurasidone  40 mg Oral QHS  . omega-3 acid ethyl esters  2 g Oral BID  . pantoprazole  80 mg Oral Daily  . venlafaxine XR  150 mg Oral BID  . 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  INR therapeutic and now off lovenox bridging.  No bleeding noted. -INR increased to 2.46 -No S/Sx bleeding noted  Goal of Therapy:  INR 2-3 Monitor platelets by anticoagulation protocol: Yes   Plan: -Will give  warfarin 7.5 mg tonight  -F/U daily INR  Gwyndolyn KaufmanKai Rasheedah Reis Bernette Williams(Kenny), PharmD  PGY1 Pharmacy Resident Pager: [phone] 10/23/2015 9:11 AM

## 2015-10-23 NOTE — Progress Notes (Signed)
Physical Therapy Treatment Patient Details Name: Devon SangerFred Williams MRN: [id number] DOB: 03/02/1945 Today's Date: 10/23/2015    History of Present Illness Patient admitted for elective TKR on left.  PMH significant for DM, chronic kidney disease    PT Comments    Pt making progress with gt today as he increased gt distance but cont's to present with limited Lt knee ROM.  Educated pt & wife on how to complete self AAROM/stretching when sitting in recliner for lt knee flexion.  Instructed pt to complete 3 times a day 5 reps x 15-20 sec hold each rep.  Cont with current POC to maximize functional mobility prior to d/c to SNF.      Follow Up Recommendations  SNF     Equipment Recommendations  Rolling walker with 5" wheels    Recommendations for Other Services       Precautions / Restrictions Precautions Precautions: Knee;Fall Required Braces or Orthoses: Knee Immobilizer - Left Knee Immobilizer - Left: Discontinue once straight leg raise with < 10 degree lag Restrictions LLE Weight Bearing: Weight bearing as tolerated    Mobility  Bed Mobility                  Transfers Overall transfer level: Needs assistance Equipment used: Rolling walker (2 wheeled) Transfers: Sit to/from Stand Sit to Stand: Min assist         General transfer comment: cues for hand placement & LLE positioning with stand>sit.    Ambulation/Gait Ambulation/Gait assistance: Min guard Ambulation Distance (Feet): 35 Feet Assistive device: Rolling walker (2 wheeled) Gait Pattern/deviations: Step-to pattern;Decreased weight shift to left;Decreased step length - right Gait velocity: decreased   General Gait Details: Cues for sequencing, RW advancement, & increased step length RLE.  no complaints of dizziness.     Stairs            Wheelchair Mobility    Modified Rankin (Stroke Patients Only)       Balance                                    Cognition Arousal/Alertness:  Awake/alert Behavior During Therapy: Flat affect Overall Cognitive Status: Within Functional Limits for tasks assessed                      Exercises Total Joint Exercises Ankle Circles/Pumps: AROM;Both;5 reps Quad Sets: AROM;Strengthening;Both;5 reps Straight Leg Raises: AAROM;Strengthening;Left;5 reps Long Arc Quad: AAROM;Strengthening;Left;5 reps Knee Flexion: AAROM;Left;10 reps;Seated (educated on self AAROM sitting) Goniometric ROM: Lt knee flexion 52 degress PROM.      General Comments        Pertinent Vitals/Pain Pain Assessment: 0-10 Pain Score: 6  Pain Location: Lt knee Pain Intervention(s): Limited activity within patient's tolerance;Monitored during session;Repositioned    Home Living                      Prior Function            PT Goals (current goals can now be found in the care plan section) Acute Rehab PT Goals Patient Stated Goal: none stated PT Goal Formulation: With patient/family Time For Goal Achievement: 10/22/15 Potential to Achieve Goals: Good Progress towards PT goals: Progressing toward goals (slowly)    Frequency    7X/week      PT Plan Current plan remains appropriate    Co-evaluation  End of Session Equipment Utilized During Treatment: Gait belt;Left knee immobilizer Activity Tolerance: Patient tolerated treatment well Patient left: in chair;with family/visitor present;with call bell/phone within reach     Time: 1032-1100 PT Time Calculation (min) (ACUTE ONLY): 28 min  Charges:  $Gait Training: 8-22 mins $Therapeutic Exercise: 8-22 mins                    G Codes:      Devon Williams 10/23/2015, 12:25 PM   Devon Williams, PTA [phone] 10/23/2015

## 2015-10-23 NOTE — Progress Notes (Signed)
Devon Williams  MRN: [id number] DOB/Age: 69/01/1945 69 y.o. Physician: Ranell PatrickNorris Procedure: Procedure(s) (LRB): LEFT TOTAL KNEE ARTHROPLASTY (Left)     Subjective: Overall progressing and feeling better. Just placed in CPM for am session. Voiding and eating well but no BM yet. Positive flatus  Vital Signs Temp:  [98.7 F (37.1 C)-99.3 F (37.4 C)] 99.3 F (37.4 C) (09/30 2349) Pulse Rate:  [67-74] 73 (09/30 2349) Resp:  [17] 17 (09/30 1452) BP: (119-131)/(61-76) 119/61 (09/30 2349) SpO2:  [94 %-95 %] 94 % (09/30 2349)  Lab Results  Recent Labs  10/23/15 0736  WBC 15.5*  HGB 13.4  HCT 41.1  PLT 421*   BMET  Recent Labs  10/21/15 0515  CREATININE 1.30*   INR  Date Value Ref Range Status  10/22/2015 2.10  Final     Exam Belly soft and NT Left knee dressing dry NVI        Plan Cont PT until [facility] approval on SNF/ST rehab On [place], monitor GI    Amos Micheals PA-C    10/23/2015, 8:22 AM Contact # [phone]

## 2015-10-24 LAB — GLUCOSE, CAPILLARY
Glucose-Capillary: 157 mg/dL — ABNORMAL HIGH (ref 65–99)
Glucose-Capillary: 199 mg/dL — ABNORMAL HIGH (ref 65–99)
Glucose-Capillary: 139 mg/dL — ABNORMAL HIGH (ref 65–99)
Glucose-Capillary: 212 mg/dL — ABNORMAL HIGH (ref 65–99)

## 2015-10-24 LAB — PROTIME-INR
INR: 2.61
PROTHROMBIN TIME: 28.5 s — AB (ref 11.4–15.2)

## 2015-10-24 MED ORDER — WARFARIN SODIUM 5 MG PO TABS
5.0000 mg | ORAL_TABLET | Freq: Once | ORAL | Status: AC
  Administered 2015-10-24: 5 mg via ORAL
  Filled 2015-10-24: qty 1

## 2015-10-24 NOTE — Progress Notes (Signed)
Physical Therapy Treatment Patient Details Name: Fatima SangerFred Care MRN: [id number] DOB: 11/15/1945 Today's Date: 10/24/2015    History of Present Illness Patient admitted for elective TKR on left.  PMH significant for DM, chronic kidney disease    PT Comments    Pt tolerated pm treatment well.  Pt able to progress gait distance but remains to require cues for R step length and foot clearance.  Pt continues to await placement.    Follow Up Recommendations  SNF     Equipment Recommendations  Rolling walker with 5" wheels    Recommendations for Other Services       Precautions / Restrictions Precautions Precautions: Knee;Fall Precaution Booklet Issued: No Required Braces or Orthoses: Knee Immobilizer - Left Knee Immobilizer - Left: Discontinue once straight leg raise with < 10 degree lag Restrictions Weight Bearing Restrictions: Yes LLE Weight Bearing: Weight bearing as tolerated    Mobility  Bed Mobility Overal bed mobility: Needs Assistance Bed Mobility: Supine to Sit     Supine to sit: Supervision     General bed mobility comments: Good technique bed placed in flat position with use of R rail.    Transfers Overall transfer level: Needs assistance Equipment used: Rolling walker (2 wheeled)   Sit to Stand: Min guard         General transfer comment: Cues for forward weight shifting and hand placement.  Good carryover for hand placement.    Ambulation/Gait Ambulation/Gait assistance: Min guard Ambulation Distance (Feet): 160 Feet Assistive device: Rolling walker (2 wheeled) Gait Pattern/deviations: Step-to pattern;Trunk flexed;Decreased stride length;Decreased step length - right Gait velocity: decreased   General Gait Details: Cues for sequeing, decreased stride length and foot clearance on R side.  Cues for upper trunk/head control.     Stairs            Wheelchair Mobility    Modified Rankin (Stroke Patients Only)       Balance     Sitting  balance-Leahy Scale: Good       Standing balance-Leahy Scale: Fair                      Cognition Arousal/Alertness: Awake/alert Behavior During Therapy: WFL for tasks assessed/performed Overall Cognitive Status: Within Functional Limits for tasks assessed                      Exercises      General Comments        Pertinent Vitals/Pain Pain Assessment: 0-10 Pain Score: 6  Pain Location: L knee  Pain Descriptors / Indicators: Sore;Grimacing;Guarding Pain Intervention(s): Monitored during session;Repositioned;Ice applied    Home Living                      Prior Function            PT Goals (current goals can now be found in the care plan section) Acute Rehab PT Goals Patient Stated Goal: none stated Potential to Achieve Goals: Good Progress towards PT goals: Progressing toward goals    Frequency    7X/week      PT Plan Current plan remains appropriate    Co-evaluation             End of Session Equipment Utilized During Treatment: Gait belt Activity Tolerance: Patient tolerated treatment well Patient left: in chair;with family/visitor present;with call bell/phone within reach     Time: 1652-1710 PT Time Calculation (min) (ACUTE ONLY): 18 min  Charges:  $Gait Training: 8-22 mins                    G Codes:      Florestine Avers 17-Nov-2015, 5:20 PM  Joycelyn Rua, PTA pager [phone]

## 2015-10-24 NOTE — Progress Notes (Signed)
Orthopedic Tech Progress Note Patient Details:  Devon SangerFred Williams 01/22/1946 [id number]  CPM Left Knee CPM Left Knee: On Left Knee Flexion (Degrees): 55 Left Knee Extension (Degrees): 0 Additional Comments: trapeze bar patient helper   [name] 10/24/2015, 3:30 PM

## 2015-10-24 NOTE — Progress Notes (Signed)
ANTICOAGULATION CONSULT NOTE - Follow Up Consult  Pharmacy Consult for warfarin Indication: VTE prophylaxis  Allergies  Allergen Reactions  . Nabumetone Other (See Comments)    UNSPECIFIED REACTION     Patient Measurements: Weight: 277 lb (125.6 kg)  Vital Signs: Temp: 98.6 F (37 C) (10/02 0554) Temp Source: Oral (10/02 0554) BP: 124/68 (10/02 0946) Pulse Rate: 71 (10/02 0946)  Labs:  Recent Labs  10/22/15 0531 10/23/15 0736 10/24/15 0221  HGB  --  13.4  --   HCT  --  41.1  --   PLT  --  421*  --   LABPROT 23.9* 27.1* 28.5*  INR 2.10 2.46 2.61    Estimated Creatinine Clearance: 72.4 mL/min (by C-G formula based on SCr of 1.3 mg/dL (H)).   Medications:  Scheduled:  . acetaminophen  1,000 mg Oral BID  . amoxicillin-clavulanate  1 tablet Oral Q12H  . aspirin EC  81 mg Oral QHS  . atorvastatin  80 mg Oral Daily  . Besifloxacin HCl  1 drop Right Eye TID  . Bromfenac Sodium  1 drop Ophthalmic QHS  . carvedilol  6.25 mg Oral BID WC  . cholecalciferol  2,000 Units Oral Daily  . coumadin book   Does not apply Once  . Difluprednate  1 drop Left Eye 2 times per day  . docusate sodium  100 mg Oral BID  . ferrous sulfate  325 mg Oral TID PC  . insulin aspart  0-20 Units Subcutaneous TID WC  . insulin aspart  0-5 Units Subcutaneous QHS  . insulin aspart  6 Units Subcutaneous TID WC  . loratadine  10 mg Oral Daily  . lurasidone  40 mg Oral QHS  . omega-3 acid ethyl esters  2 g Oral BID  . pantoprazole  80 mg Oral Daily  . venlafaxine XR  150 mg Oral BID  . Warfarin - Pharmacist Dosing Inpatient   Does not apply q1800    Assessment: 70 [facility], now s/p left TKA on 9/22 on warfarin for VTE prophylaxis. INR today is 2.61 on 7.5 mg. Has been trending up steadily for the past few days. Hgb improving, pltc wnl. No s/sx of bleeding noted.   Goal of Therapy:  INR 2-3 Monitor platelets by anticoagulation protocol:  Yes   Plan:  Warfarin 5mg  x1 tonight Daily PT/INR Follow-up discharge plans  Alfredo BachJoseph Zaleah Ternes, Cleotis NipperBS, PharmD Clinical Pharmacy Resident [phone] (Pager) 10/24/2015 11:21 AM

## 2015-10-24 NOTE — Progress Notes (Addendum)
Physical Therapy Treatment Patient Details Name: Devon SangerFred Williams MRN: [id number] DOB: 12/21/1945 Today's Date: 10/24/2015    History of Present Illness Patient admitted for elective TKR on left.  PMH significant for DM, chronic kidney disease    PT Comments    Pt performed increased gait and required decreased assist with LE supine exercises.  Pt remains to wait on insurance authorization for placement.  Pt continues to benefit from skilled rehab to improve strength and functional mobility before returning home.  Informed supervising PT to update goals as pt remains hospitalized.   Follow Up Recommendations  SNF     Equipment Recommendations  Rolling walker with 5" wheels    Recommendations for Other Services       Precautions / Restrictions Precautions Precautions: Knee;Fall Precaution Booklet Issued: No Required Braces or Orthoses: Knee Immobilizer - Left Knee Immobilizer - Left: Discontinue once straight leg raise with < 10 degree lag Restrictions Weight Bearing Restrictions: Yes LLE Weight Bearing: Weight bearing as tolerated    Mobility  Bed Mobility Overal bed mobility: Needs Assistance       Supine to sit: Min guard     General bed mobility comments: Pt used gait belt to advance LLE tio edge of bed, pt able to use rail on L side to push trunk into seated position.  Min guard for safety as patient is unsteady during transition.    Transfers Overall transfer level: Needs assistance Equipment used: Rolling walker (2 wheeled) Transfers: Sit to/from Stand Sit to Stand: Min assist;Min guard         General transfer comment: Cues for hand placement and positioning of LLE.  Pt required cues for eccentric loading and forward weight shifting.    Ambulation/Gait Ambulation/Gait assistance: Min guard Ambulation Distance (Feet): 84 Feet Assistive device: Rolling walker (2 wheeled) Gait Pattern/deviations: Step-to pattern;Trunk flexed;Decreased stride  length;Antalgic Gait velocity: decreased   General Gait Details: Cues for sequeing, decreased stride length and foot clearance on R side.  Cues for upper trunk/herad control.     Stairs            Wheelchair Mobility    Modified Rankin (Stroke Patients Only)       Balance Overall balance assessment: Needs assistance Sitting-balance support: Feet supported Sitting balance-Leahy Scale: Poor Sitting balance - Comments: Able to sit with no UE support for grooming task at EOB with min guard assistance to maintain balance.                            Cognition Arousal/Alertness: Awake/alert Behavior During Therapy: Flat affect Overall Cognitive Status: Within Functional Limits for tasks assessed                      Exercises Total Joint Exercises Ankle Circles/Pumps: AROM;Both;10 reps;Supine Quad Sets: AROM;Left;10 reps;Supine Heel Slides: AAROM;Left;10 reps;Supine Hip ABduction/ADduction: AAROM;Left;10 reps;Supine Straight Leg Raises: AAROM;Left;10 reps;Supine Goniometric ROM: 64 degrees flexion in L knee    General Comments        Pertinent Vitals/Pain Pain Assessment: Faces Faces Pain Scale: Hurts a little bit Pain Location: L knee Pain Descriptors / Indicators: Sore;Grimacing;Guarding Pain Intervention(s): Monitored during session;Ice applied;Repositioned    Home Living                      Prior Function            PT Goals (current goals can now be found in  the care plan section) Acute Rehab PT Goals Patient Stated Goal: none stated Potential to Achieve Goals: Good Progress towards PT goals: Progressing toward goals    Frequency    7X/week      PT Plan Current plan remains appropriate    Co-evaluation             End of Session Equipment Utilized During Treatment: Gait belt Activity Tolerance: Patient tolerated treatment well Patient left: in chair;with family/visitor present;with call bell/phone within  reach     Time: 1050-1123 PT Time Calculation (min) (ACUTE ONLY): 33 min  Charges:  $Gait Training: 8-22 mins $Therapeutic Exercise: 8-22 mins                    G Codes:      Florestine Avers Oct 30, 2015, 11:33 AM  Joycelyn Rua, PTA pager [phone]

## 2015-10-24 NOTE — Clinical Social Work Note (Signed)
[place] [facility] [place] [facility]  [facility].  [place] by [facility].  VA states transportation will be at the hospital by 8:30am to transport patient.  CS[facility] will [facility].  As of yet, CSW has not received a call back from the 3 contracted facilities to determine bed availability.  CSW updated patient, wife and Charity fundraiserN.  [facility]: [facility] 8[facility]30[facility] pickup  Vickii PennaGina Shaneika Rossa, MSW, LCSW  [phone]  Licensed Clinical Social Worker

## 2015-10-24 NOTE — Clinical Social Work Note (Signed)
CSW contacted [facility] (on several occassions) this morning/afternoon regarding update of status on SNF placement.  VA requested additional clinicals which were sent immediately at 10:07am.  CSW confirmed the receipt of requested clinicals.  VA liaison states an answer should come between 3-4pm today. CS[facility] contacted [facility] to inquire of bed availability just in case [facility] not [facility].  CSW left message and is expecting a call back.  CSW contacted [facility] [address] and [facility] [place] as well to inquire about bed availability as a back up to [facility].  CSW left message and is awaiting a return call.  Wife at bedside and RN updated.  Vickii PennaGina Teea Ducey, MSW, LCSW  [phone]  Licensed Clinical Social Worker

## 2015-10-24 NOTE — Progress Notes (Signed)
Orthopedics Progress Note  Subjective: Patient feeling much better and looking forward to rehab  Objective:  Vitals:   10/23/15 2005 10/24/15 0554  BP: 118/78 127/60  Pulse: 72 69  Resp:    Temp: 98.5 F (36.9 C) 98.6 F (37 C)    General: Awake and alert  Musculoskeletal: left knee wound benign, great extension Neurovascularly intact  Lab Results  Component Value Date   WBC 15.5 (H) 10/23/2015   HGB 13.4 10/23/2015   HCT 41.1 10/23/2015   MCV 93.2 10/23/2015   PLT 421 (H) 10/23/2015       Component Value Date/Time   NA 132 (L) 10/18/2015 0304   K 3.9 10/18/2015 0304   CL 101 10/18/2015 0304   CO2 23 10/18/2015 0304   GLUCOSE 171 (H) 10/18/2015 0304   BUN 17 10/18/2015 0304   CREATININE 1.30 (H) 10/21/2015 0515   CALCIUM 8.9 10/18/2015 0304   GFRNONAA 54 (L) 10/21/2015 0515   GFRAA >60 10/21/2015 0515    Lab Results  Component Value Date   INR 2.61 10/24/2015   INR 2.46 10/23/2015   INR 2.10 10/22/2015    Assessment/Plan: POD #10 s/p Procedure(s): LEFT TOTAL KNEE ARTHROPLASTY Patient stable for discharge to SNF/Rehab Follow up in one week in the office with me Pending [facility] authorization for discharge - d/c order in  [place] R. Ranell PatrickNorris, MD 10/24/2015 7:08 AM

## 2015-10-25 LAB — GLUCOSE, CAPILLARY
GLUCOSE-CAPILLARY: 184 mg/dL — AB (ref 65–99)
Glucose-Capillary: 154 mg/dL — ABNORMAL HIGH (ref 65–99)
Glucose-Capillary: 208 mg/dL — ABNORMAL HIGH (ref 65–99)
Glucose-Capillary: 189 mg/dL — ABNORMAL HIGH (ref 65–99)

## 2015-10-25 LAB — PROTIME-INR
INR: 2.5
Prothrombin Time: 27.5 seconds — ABNORMAL HIGH (ref 11.4–15.2)

## 2015-10-25 MED ORDER — WARFARIN SODIUM 7.5 MG PO TABS
7.5000 mg | ORAL_TABLET | Freq: Once | ORAL | Status: AC
Start: 2015-10-25 — End: 2015-10-25
  Administered 2015-10-25: 7.5 mg via ORAL
  Filled 2015-10-25: qty 1

## 2015-10-25 NOTE — Progress Notes (Signed)
Physical Therapy Treatment Patient Details Name: Devon Williams MRN: [id number] DOB: [birth year] Today's Date: 10/25/2015    History of Present Illness Patient admitted for elective TKR on left.  PMH significant for DM, chronic kidney disease    PT Comments    Pt performed gait training and plans to d/c to SNF tomorrow.  Pt remains guarded and required cues to correct gait deviations.  Placed patient back on CPM to improve L knee flexion.    Follow Up Recommendations  SNF     Equipment Recommendations  Rolling walker with 5" wheels    Recommendations for Other Services       Precautions / Restrictions Precautions Precautions: Knee;Fall Precaution Booklet Issued: No Required Braces or Orthoses: Knee Immobilizer - Left Knee Immobilizer - Left: Discontinue once straight leg raise with < 10 degree lag Restrictions Weight Bearing Restrictions: Yes LLE Weight Bearing: Weight bearing as tolerated    Mobility  Bed Mobility Overal bed mobility: Needs Assistance Bed Mobility: Supine to Sit     Supine to sit: Min assist (required increased assist to remove LLE from CPM.  ) Sit to supine: Min assist   General bed mobility comments: Pt required increased assist to manuever in and out of bed from CPM and back into CPM.    Transfers Overall transfer level: Needs assistance Equipment used: Rolling walker (2 wheeled) Transfers: Sit to/from Stand Sit to Stand: Min guard         General transfer comment: Cues for hand placement  Ambulation/Gait Ambulation/Gait assistance: Min guard Ambulation Distance (Feet): 160 Feet Assistive device: Rolling walker (2 wheeled) Gait Pattern/deviations: Step-to pattern;Decreased step length - right;Shuffle;Antalgic Gait velocity: decreased   General Gait Details: Cues for sequeing, decreased stride length and foot clearance on R side.  Cues for upper trunk/head control.     Stairs            Wheelchair Mobility    Modified Rankin  (Stroke Patients Only)       Balance Overall balance assessment: Needs assistance   Sitting balance-Leahy Scale: Good       Standing balance-Leahy Scale: Fair                      Cognition Arousal/Alertness: Awake/alert Behavior During Therapy: WFL for tasks assessed/performed Overall Cognitive Status: Within Functional Limits for tasks assessed                      Exercises Total Joint Exercises Goniometric ROM: 70 degrees flexion in L knee.      General Comments        Pertinent Vitals/Pain Pain Assessment: Faces Pain Score: 6  Faces Pain Scale: Hurts little more Pain Location: L knee Pain Descriptors / Indicators: Discomfort;Grimacing;Sore Pain Intervention(s): Monitored during session;Repositioned    Home Living                      Prior Function            PT Goals (current goals can now be found in the care plan section) Acute Rehab PT Goals Patient Stated Goal: to go to SNFfor rehabilitation Potential to Achieve Goals: Good Progress towards PT goals: Progressing toward goals    Frequency    7X/week      PT Plan Current plan remains appropriate    Co-evaluation             End of Session Equipment Utilized During Treatment: Gait belt  Activity Tolerance: Patient tolerated treatment well Patient left: in chair;with family/visitor present;with call bell/phone within reach     Time: 1445-1503 PT Time Calculation (min) (ACUTE ONLY): 18 min  Charges:  $Gait Training: 8-22 mins                    G Codes:      Florestine Aversimee J Joesphine Schemm 10/25/2015, 3:17 PM  Joycelyn RuaAimee Jeramiah Mccaughey, PTA pager [phone]

## 2015-10-25 NOTE — Progress Notes (Signed)
ANTICOAGULATION CONSULT NOTE - Follow Up Consult  Pharmacy Consult for warfarin Indication: VTE prophylaxis  Allergies  Allergen Reactions  . Nabumetone Other (See Comments)    UNSPECIFIED REACTION     Patient Measurements: Weight: 277 lb (125.6 kg)  Vital Signs: Temp: 98.7 F (37.1 C) (10/03 0457) Temp Source: Oral (10/03 0457) BP: 121/72 (10/03 0457) Pulse Rate: 76 (10/03 0457)  Labs:  Recent Labs  10/23/15 0736 10/24/15 0221 10/25/15 0623  HGB 13.4  --   --   HCT 41.1  --   --   PLT 421*  --   --   LABPROT 27.1* 28.5* 27.5*  INR 2.46 2.61 2.50    Estimated Creatinine Clearance: 72.4 mL/min (by C-G formula based on SCr of 1.3 mg/dL (H)).   Medications:  Scheduled:  . acetaminophen  1,000 mg Oral BID  . amoxicillin-clavulanate  1 tablet Oral Q12H  . aspirin EC  81 mg Oral QHS  . atorvastatin  80 mg Oral Daily  . Besifloxacin HCl  1 drop Right Eye TID  . Bromfenac Sodium  1 drop Ophthalmic QHS  . carvedilol  6.25 mg Oral BID WC  . cholecalciferol  2,000 Units Oral Daily  . coumadin book   Does not apply Once  . Difluprednate  1 drop Left Eye 2 times per day  . docusate sodium  100 mg Oral BID  . ferrous sulfate  325 mg Oral TID PC  . insulin aspart  0-20 Units Subcutaneous TID WC  . insulin aspart  0-5 Units Subcutaneous QHS  . insulin aspart  6 Units Subcutaneous TID WC  . loratadine  10 mg Oral Daily  . lurasidone  40 mg Oral QHS  . omega-3 acid ethyl esters  2 g Oral BID  . pantoprazole  80 mg Oral Daily  . venlafaxine XR  150 mg Oral BID  . Warfarin - Pharmacist Dosing Inpatient   Does not apply q1800    Assessment: 70 yo male s/p left TKA on 9/22 on warfarin for VTE prophylaxis. INR today is 2.5. Hgb stable, pltc wnl. No s/sx of bleeding noted.  *Consider daily dose of warfarin 7.5 mg for discharge with f/u INR in 3 days.  Goal of Therapy:  INR 2-3 Monitor platelets by anticoagulation protocol: Yes   Plan:  Warfarin 7.5mg  x1  tonight Monitor daily INR, CBC, clinical course, s/sx of bleed, PO intake, DDI   Thank you for allowing us to participate in this patients care. Signe Coltonya C Clide Remmers, PharmD Phone: [phone] 10/25/2015 2:15 PM

## 2015-10-25 NOTE — Progress Notes (Signed)
Occupational Therapy Treatment Patient Details Name: Devon Williams MRN: [id number] DOB: [birth year] Today's Date: 10/25/2015    History of present illness Patient admitted for elective TKR on left.  PMH significant for DM, chronic kidney disease   OT comments  Pt with much improved orientation, sustained attention, and direction following. Pt completed grooming tasks seated at sink with min guard assist for safety to maintain balance during dynamic sitting activity. Pt requires min assist and increased time and VC's for safety during toilet transfer with Rw. Per wife, pt plans to D/C to SNF either today or tomorrow. D/C plan remains appropriate. Continued education concerning expectations for SNF stay and safety during ADLs and pt/wife require further reinforcement. Pt would continue to benefit from OT services during acute care stay. OT to continue to follow.   Follow Up Recommendations  SNF    Equipment Recommendations  Other (comment) (Defer to subsequent venue)    Recommendations for Other Services      Precautions / Restrictions Precautions Precautions: Knee;Fall Precaution Booklet Issued: No Required Braces or Orthoses: Knee Immobilizer - Left Knee Immobilizer - Left: Discontinue once straight leg raise with < 10 degree lag Restrictions Weight Bearing Restrictions: Yes LLE Weight Bearing: Weight bearing as tolerated       Mobility Bed Mobility Overal bed mobility: Needs Assistance Bed Mobility: Supine to Sit;Sit to Supine     Supine to sit: Min guard Sit to supine: Supervision   General bed mobility comments: Much improved; completed with bed in flat position with use of R rail.  Transfers Overall transfer level: Needs assistance Equipment used: Rolling walker (2 wheeled) Transfers: Sit to/from Stand Sit to Stand: Min assist         General transfer comment: Increased assist for toilet transfer in bathroom; cues for proper hand placement.    Balance Overall  balance assessment: Needs assistance Sitting-balance support: No upper extremity supported;Feet supported Sitting balance-Leahy Scale: Good Sitting balance - Comments: Sat at sink for grooming task with no UE support; min guard for dynamic activity.   Standing balance support: Bilateral upper extremity supported;During functional activity Standing balance-Leahy Scale: Fair                     ADL Overall ADL's : Needs assistance/impaired     Grooming: Wash/dry face;Min guard;Sitting;Cueing for safety Grooming Details (indicate cue type and reason): Sat at sink for grooming tasks, min guard for maintaining balance while seated on 3 in 1.                 Toilet Transfer: Minimal assistance;Ambulation;BSC;RW           Functional mobility during ADLs: Minimal assistance;Rolling walker General ADL Comments: Educated on expectations for rehabilitation stay. Pt with much improved dynamic sitting balance for grooming at the sink. Pt able to support self with no upper extremity support.                Cognition   Behavior During Therapy: WFL for tasks assessed/performed Overall Cognitive Status: Within Functional Limits for tasks assessed                  General Comments: Pt much improved with orientation, attention, and following commands as compared to previous sessions. Followed commands consistently and was oriented throughout session.                 Pertinent Vitals/ Pain       Pain Assessment: Faces Faces Pain Scale: Hurts  little more Pain Location: L knee Pain Descriptors / Indicators: Discomfort;Guarding;Sore Pain Intervention(s): Monitored during session;Repositioned         Frequency  Min 2X/week        Progress Toward Goals  OT Goals(current goals can now be found in the care plan section)  Progress towards OT goals: Progressing toward goals  Acute Rehab OT Goals Patient Stated Goal: to go to SNFfor rehabilitation OT Goal  Formulation: With patient/family Time For Goal Achievement: 11/01/15 Potential to Achieve Goals: Fair ADL Goals Pt Will Perform Grooming: with supervision;with set-up;bed level Pt Will Perform Upper Body Bathing: with min assist;sitting Pt Will Perform Lower Body Bathing: with mod assist;sit to/from stand Pt Will Perform Upper Body Dressing: sitting;with min assist Pt Will Perform Lower Body Dressing: with mod assist;sit to/from stand Pt Will Transfer to Toilet: with supervision;bedside commode;ambulating Pt Will Perform Toileting - Clothing Manipulation and hygiene: with mod assist;sit to/from stand  Plan Discharge plan remains appropriate       End of Session Equipment Utilized During Treatment: Gait belt;Rolling walker   Activity Tolerance Patient tolerated treatment well   Patient Left in bed;with call bell/phone within reach;with family/visitor present        Time: 0960-45400808-0844 OT Time Calculation (min): 36 min  Charges: OT General Charges $OT Visit: 1 Procedure OT Treatments $Self Care/Home Management : 23-37 mins  Doristine SectionCharity A Mavis Fichera, OTR/L [phone] 10/25/2015, 9:43 AM

## 2015-10-25 NOTE — Progress Notes (Signed)
Orthopedic Tech Progress Note Patient Details:  Devon SangerFred Williams 11/10/1945 [id number]  Patient ID: Devon SangerFred Williams, male   DOB: 12/22/1945, 70 y.o.   MRN: [id number] Pt refused cpm  [name], [name] 10/25/2015, 6:24 AM

## 2015-10-25 NOTE — Progress Notes (Signed)
Orthopedics Progress Note  Subjective: Patient with some pain with knee ROM  Objective:  Vitals:   10/24/15 2138 10/25/15 0457  BP: (!) 118/55 121/72  Pulse: 75 76  Resp: 18 18  Temp: 99 F (37.2 C) 98.7 F (37.1 C)    General: Awake and alert  Musculoskeletal: 5-85 degrees AAROM in bed with me. No pain with calf pumps Neurovascularly intact  Lab Results  Component Value Date   WBC 15.5 (H) 10/23/2015   HGB 13.4 10/23/2015   HCT 41.1 10/23/2015   MCV 93.2 10/23/2015   PLT 421 (H) 10/23/2015       Component Value Date/Time   NA 132 (L) 10/18/2015 0304   K 3.9 10/18/2015 0304   CL 101 10/18/2015 0304   CO2 23 10/18/2015 0304   GLUCOSE 171 (H) 10/18/2015 0304   BUN 17 10/18/2015 0304   CREATININE 1.30 (H) 10/21/2015 0515   CALCIUM 8.9 10/18/2015 0304   GFRNONAA 54 (L) 10/21/2015 0515   GFRAA >60 10/21/2015 0515    Lab Results  Component Value Date   INR 2.50 10/25/2015   INR 2.61 10/24/2015   INR 2.46 10/23/2015    Assessment/Plan: POD #11 s/p Procedure(s): LEFT TOTAL KNEE ARTHROPLASTY Plan per SW is [facility] tomorrow AM. Cleared for discharge from ortho and medicine standpoint Follow up in the office with me in one week  Almedia BallsSteven R. Ranell PatrickNorris, MD 10/25/2015 1:42 PM

## 2015-10-25 NOTE — Clinical Social Work Note (Signed)
MD PLEASE UPDATE THE DC SUMMARY for [facility] to [facility].  Thanks!  Vickii PennaGina Aydin Cavalieri, MSW, LCSW  [phone]  Licensed Clinical Social Worker

## 2015-10-25 NOTE — Progress Notes (Signed)
Orthopedic Tech Progress Note Patient Details:  Devon SangerFred Williams 12/24/1945 [id number]  CPM Left Knee CPM Left Knee: On Left Knee Flexion (Degrees): 55 Left Knee Extension (Degrees): 0 Additional Comments: trapeze bar patient helper   Saul FordyceJennifer C Kyan Giannone 10/25/2015, 2:58 PM

## 2015-10-26 LAB — PROTIME-INR
INR: 2.27
PROTHROMBIN TIME: 25.5 s — AB (ref 11.4–15.2)

## 2015-10-26 LAB — GLUCOSE, CAPILLARY
GLUCOSE-CAPILLARY: 176 mg/dL — AB (ref 65–99)
GLUCOSE-CAPILLARY: 196 mg/dL — AB (ref 65–99)

## 2015-10-26 NOTE — Progress Notes (Signed)
Orthopedic Tech Progress Note Patient Details:  Fatima SangerFred Catania 01/13/1946 [id number]  Patient ID: Fatima SangerFred Williamson, male   DOB: 10/19/1945, 70 y.o.   MRN: [id number] Pt refused cpm  [name], Airyanna Dipalma J 10/26/2015, 6:18 AM

## 2015-10-26 NOTE — Discharge Summary (Signed)
Physician Discharge Summary   Patient ID: Devon Williams MRN: [id number] DOB/AGE: [birth year] 70 y.o.  Admit date: 10/14/2015 Discharge date: 10/26/2015  Admission Diagnoses:  Active Problems:   H/O total knee replacement   Essential hypertension   Acute encephalopathy   DM (diabetes mellitus), type 2 with renal complications (HCC)   CKD (chronic kidney disease), stage III   Pneumonia   Discharge Diagnoses:  Same   Surgeries: Procedure(s): LEFT TOTAL KNEE ARTHROPLASTY on 10/14/2015   Consultants: Internal Medicine, Neurology, PT, OT, SW  Discharged Condition: Stable  Hospital Course: Devon Williams is an 70 y.o. 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  They were brought to the operating room on 10/14/2015 and underwent the above named procedures.    The patient experienced a syncopal episode and a Code Stroke was called.  Stroke work up by neurology including brain imaging and carotid doppler negative. Episode and orthostatic hypotension secondary to low volume and also development of LLL pneumonia.  Medicine consulted and patient treated with po Augmentin.  Symptoms resolved and patient ready for D/C to SNF rehab to [facility] knee ROM and prepare for patient returning home. PT, OT agree with SNF.  Recent vital signs:  Vitals:   10/25/15 2149 10/26/15 0450  BP: 122/60 121/70  Pulse: 81 71  Resp: 18 17  Temp: 98.1 F (36.7 C) 98 F (36.7 C)    Recent laboratory studies:  Results for orders placed or performed during the hospital encounter of 10/14/15  Urine culture  Result Value Ref Range   Specimen Description URINE, CLEAN CATCH    Special Requests NONE    Culture NO GROWTH    Report Status 10/19/2015 FINAL   Glucose, capillary  Result Value Ref Range   Glucose-Capillary 120 (H) 65 - 99 mg/dL   Comment 1 Notify RN    Comment 2 Document in Chart   Glucose, capillary  Result Value Ref  Range   Glucose-Capillary 154 (H) 65 - 99 mg/dL  Protime-INR  Result Value Ref Range   Prothrombin Time 13.7 11.4 - 15.2 seconds   INR 1.05   CBC  Result Value Ref Range   WBC 13.9 (H) 4.0 - 10.5 K/uL   RBC 4.53 4.22 - 5.81 MIL/uL   Hemoglobin 14.0 13.0 - 17.0 g/dL   HCT 40.9 81.1 - 91.4 %   MCV 94.3 78.0 - 100.0 fL   MCH 30.9 26.0 - 34.0 pg   MCHC 32.8 30.0 - 36.0 g/dL   RDW 78.2 95.6 - 21.3 %   Platelets 205 150 - 400 K/uL  Creatinine, serum  Result Value Ref Range   Creatinine, Ser 1.44 (H) 0.61 - 1.24 mg/dL   GFR calc non Af Amer 48 (L) >60 mL/min   GFR calc Af Amer 56 (L) >60 mL/min  CBC  Result Value Ref Range   WBC 13.6 (H) 4.0 - 10.5 K/uL   RBC 4.52 4.22 - 5.81 MIL/uL   Hemoglobin 13.9 13.0 - 17.0 g/dL   HCT 08.6 57.8 - 46.9 %   MCV 95.1 78.0 - 100.0 fL   MCH 30.8 26.0 - 34.0 pg   MCHC 32.3 30.0 - 36.0 g/dL   RDW 62.9 52.8 - 41.3 %   Platelets 219 150 - 400 K/uL  Basic metabolic panel  Result Value Ref Range   Sodium 134 (L) 135 - 145 mmol/L   Potassium 4.5 3.5 -  5.1 mmol/L   Chloride 101 101 - 111 mmol/L   CO2 24 22 - 32 mmol/L   Glucose, Bld 174 (H) 65 - 99 mg/dL   BUN 19 6 - 20 mg/dL   Creatinine, Ser 1.61 (H) 0.61 - 1.24 mg/dL   Calcium 9.0 8.9 - 09.6 mg/dL   GFR calc non Af Amer 45 (L) >60 mL/min   GFR calc Af Amer 52 (L) >60 mL/min   Anion gap 9 5 - 15  Glucose, capillary  Result Value Ref Range   Glucose-Capillary 182 (H) 65 - 99 mg/dL  Glucose, capillary  Result Value Ref Range   Glucose-Capillary 170 (H) 65 - 99 mg/dL  Glucose, capillary  Result Value Ref Range   Glucose-Capillary 191 (H) 65 - 99 mg/dL   Comment 1 Notify RN   Glucose, capillary  Result Value Ref Range   Glucose-Capillary 153 (H) 65 - 99 mg/dL  Protime-INR  Result Value Ref Range   Prothrombin Time 16.1 (H) 11.4 - 15.2 seconds   INR 1.28   CBC  Result Value Ref Range   WBC 19.0 (H) 4.0 - 10.5 K/uL   RBC 4.08 (L) 4.22 - 5.81 MIL/uL   Hemoglobin 12.2 (L) 13.0 - 17.0  g/dL   HCT 04.5 (L) 40.9 - 81.1 %   MCV 94.9 78.0 - 100.0 fL   MCH 29.9 26.0 - 34.0 pg   MCHC 31.5 30.0 - 36.0 g/dL   RDW 91.4 78.2 - 95.6 %   Platelets 208 150 - 400 K/uL  Glucose, capillary  Result Value Ref Range   Glucose-Capillary 174 (H) 65 - 99 mg/dL  Glucose, capillary  Result Value Ref Range   Glucose-Capillary 169 (H) 65 - 99 mg/dL  Glucose, capillary  Result Value Ref Range   Glucose-Capillary 193 (H) 65 - 99 mg/dL  Glucose, capillary  Result Value Ref Range   Glucose-Capillary 203 (H) 65 - 99 mg/dL   Comment 1 Notify RN   Glucose, capillary  Result Value Ref Range   Glucose-Capillary 196 (H) 65 - 99 mg/dL   Comment 1 Notify RN    Comment 2 Document in Chart   Protime-INR  Result Value Ref Range   Prothrombin Time 14.8 11.4 - 15.2 seconds   INR 1.16   CBC  Result Value Ref Range   WBC 13.9 (H) 4.0 - 10.5 K/uL   RBC 3.78 (L) 4.22 - 5.81 MIL/uL   Hemoglobin 11.5 (L) 13.0 - 17.0 g/dL   HCT 21.3 (L) 08.6 - 57.8 %   MCV 92.1 78.0 - 100.0 fL   MCH 30.4 26.0 - 34.0 pg   MCHC 33.0 30.0 - 36.0 g/dL   RDW 46.9 62.9 - 52.8 %   Platelets 185 150 - 400 K/uL  Hemoglobin A1c  Result Value Ref Range   Hgb A1c MFr Bld 7.2 (H) 4.8 - 5.6 %   Mean Plasma Glucose 160 mg/dL  Lipid panel  Result Value Ref Range   Cholesterol 131 0 - 200 mg/dL   Triglycerides 413 <244 mg/dL   HDL 44 >01 mg/dL   Total CHOL/HDL Ratio 3.0 RATIO   VLDL 23 0 - 40 mg/dL   LDL Cholesterol 64 0 - 99 mg/dL  Glucose, capillary  Result Value Ref Range   Glucose-Capillary 181 (H) 65 - 99 mg/dL  Glucose, capillary  Result Value Ref Range   Glucose-Capillary 178 (H) 65 - 99 mg/dL  Glucose, capillary  Result Value Ref Range   Glucose-Capillary 188 (  H) 65 - 99 mg/dL  Glucose, capillary  Result Value Ref Range   Glucose-Capillary 120 (H) 65 - 99 mg/dL  Protime-INR  Result Value Ref Range   Prothrombin Time 17.7 (H) 11.4 - 15.2 seconds   INR 1.45   Lactic acid, plasma  Result Value Ref Range    Lactic Acid, Venous 1.3 0.5 - 1.9 mmol/L  Blood gas, venous  Result Value Ref Range   FIO2 0.21    pH, Ven 7.397 7.250 - 7.430   pCO2, Ven 37.8 (L) 44.0 - 60.0 mmHg   pO2, Ven 58.4 (H) 32.0 - 45.0 mmHg   Bicarbonate 22.8 20.0 - 28.0 mmol/L   Acid-base deficit 1.4 0.0 - 2.0 mmol/L   O2 Saturation 89.7 %   Patient temperature 98.6    Collection site VEIN    Sample type VENOUS   Ammonia  Result Value Ref Range   Ammonia 16 9 - 35 umol/L  Urinalysis, Routine w reflex microscopic (not at [facility])  Result Value Ref Range   Color, Urine YELLOW YELLOW   APPearance CLEAR CLEAR   Specific Gravity, Urine 1.015 1.005 - 1.030   pH 7.0 5.0 - 8.0   Glucose, UA 500 (A) NEGATIVE mg/dL   Hgb urine dipstick SMALL (A) NEGATIVE   Bilirubin Urine NEGATIVE NEGATIVE   Ketones, ur NEGATIVE NEGATIVE mg/dL   Protein, ur 30 (A) NEGATIVE mg/dL   Nitrite NEGATIVE NEGATIVE   Leukocytes, UA NEGATIVE NEGATIVE  CBC with Differential/Platelet  Result Value Ref Range   WBC 14.1 (H) 4.0 - 10.5 K/uL   RBC 3.73 (L) 4.22 - 5.81 MIL/uL   Hemoglobin 11.2 (L) 13.0 - 17.0 g/dL   HCT 04.5 (L) 40.9 - 81.1 %   MCV 92.5 78.0 - 100.0 fL   MCH 30.0 26.0 - 34.0 pg   MCHC 32.5 30.0 - 36.0 g/dL   RDW 91.4 78.2 - 95.6 %   Platelets 219 150 - 400 K/uL   Neutrophils Relative % 73 %   Neutro Abs 10.3 (H) 1.7 - 7.7 K/uL   Lymphocytes Relative 16 %   Lymphs Abs 2.2 0.7 - 4.0 K/uL   Monocytes Relative 10 %   Monocytes Absolute 1.5 (H) 0.1 - 1.0 K/uL   Eosinophils Relative 1 %   Eosinophils Absolute 0.1 0.0 - 0.7 K/uL   Basophils Relative 0 %   Basophils Absolute 0.0 0.0 - 0.1 K/uL  Creatinine, urine, random  Result Value Ref Range   Creatinine, Urine 46.67 mg/dL  Sodium, urine, random  Result Value Ref Range   Sodium, Ur 95 mmol/L  Comprehensive metabolic panel  Result Value Ref Range   Sodium 132 (L) 135 - 145 mmol/L   Potassium 3.9 3.5 - 5.1 mmol/L   Chloride 101 101 - 111 mmol/L   CO2 23 22 - 32 mmol/L   Glucose,  Bld 171 (H) 65 - 99 mg/dL   BUN 17 6 - 20 mg/dL   Creatinine, Ser 2.13 (H) 0.61 - 1.24 mg/dL   Calcium 8.9 8.9 - 08.6 mg/dL   Total Protein 5.9 (L) 6.5 - 8.1 g/dL   Albumin 2.3 (L) 3.5 - 5.0 g/dL   AST 68 (H) 15 - 41 U/L   ALT 29 17 - 63 U/L   Alkaline Phosphatase 95 38 - 126 U/L   Total Bilirubin 0.7 0.3 - 1.2 mg/dL   GFR calc non Af Amer 48 (L) >60 mL/min   GFR calc Af Amer 55 (L) >60 mL/min  Anion gap 8 5 - 15  Troponin I (q 6hr x 3)  Result Value Ref Range   Troponin I <0.03 <0.03 ng/mL  Glucose, capillary  Result Value Ref Range   Glucose-Capillary 145 (H) 65 - 99 mg/dL  Glucose, capillary  Result Value Ref Range   Glucose-Capillary 193 (H) 65 - 99 mg/dL  Urine microscopic-add on  Result Value Ref Range   Squamous Epithelial / LPF 0-5 (A) NONE SEEN   WBC, UA 0-5 0 - 5 WBC/hpf   RBC / HPF 0-5 0 - 5 RBC/hpf   Bacteria, UA NONE SEEN NONE SEEN  Glucose, capillary  Result Value Ref Range   Glucose-Capillary 199 (H) 65 - 99 mg/dL   Comment 1 Notify RN   Glucose, capillary  Result Value Ref Range   Glucose-Capillary 183 (H) 65 - 99 mg/dL   Comment 1 Notify RN   Protime-INR  Result Value Ref Range   Prothrombin Time 19.6 (H) 11.4 - 15.2 seconds   INR 1.64   Glucose, capillary  Result Value Ref Range   Glucose-Capillary 221 (H) 65 - 99 mg/dL  Glucose, capillary  Result Value Ref Range   Glucose-Capillary 170 (H) 65 - 99 mg/dL  Glucose, capillary  Result Value Ref Range   Glucose-Capillary 198 (H) 65 - 99 mg/dL  Glucose, capillary  Result Value Ref Range   Glucose-Capillary 230 (H) 65 - 99 mg/dL  Protime-INR  Result Value Ref Range   Prothrombin Time 23.2 (H) 11.4 - 15.2 seconds   INR 2.02   Glucose, capillary  Result Value Ref Range   Glucose-Capillary 236 (H) 65 - 99 mg/dL  Glucose, capillary  Result Value Ref Range   Glucose-Capillary 304 (H) 65 - 99 mg/dL  Glucose, capillary  Result Value Ref Range   Glucose-Capillary 209 (H) 65 - 99 mg/dL  Protime-INR   Result Value Ref Range   Prothrombin Time 23.1 (H) 11.4 - 15.2 seconds   INR 2.02   Creatinine, serum  Result Value Ref Range   Creatinine, Ser 1.30 (H) 0.61 - 1.24 mg/dL   GFR calc non Af Amer 54 (L) >60 mL/min   GFR calc Af Amer >60 >60 mL/min  Glucose, capillary  Result Value Ref Range   Glucose-Capillary 139 (H) 65 - 99 mg/dL  Glucose, capillary  Result Value Ref Range   Glucose-Capillary 170 (H) 65 - 99 mg/dL  Glucose, capillary  Result Value Ref Range   Glucose-Capillary 238 (H) 65 - 99 mg/dL   Comment 1 Notify RN   Glucose, capillary  Result Value Ref Range   Glucose-Capillary 255 (H) 65 - 99 mg/dL   Comment 1 Notify RN   Protime-INR  Result Value Ref Range   Prothrombin Time 23.9 (H) 11.4 - 15.2 seconds   INR 2.10   Glucose, capillary  Result Value Ref Range   Glucose-Capillary 202 (H) 65 - 99 mg/dL  Glucose, capillary  Result Value Ref Range   Glucose-Capillary 243 (H) 65 - 99 mg/dL  Glucose, capillary  Result Value Ref Range   Glucose-Capillary 154 (H) 65 - 99 mg/dL  Protime-INR  Result Value Ref Range   Prothrombin Time 27.1 (H) 11.4 - 15.2 seconds   INR 2.46   CBC  Result Value Ref Range   WBC 15.5 (H) 4.0 - 10.5 K/uL   RBC 4.41 4.22 - 5.81 MIL/uL   Hemoglobin 13.4 13.0 - 17.0 g/dL   HCT 16.1 09.6 - 04.5 %   MCV 93.2 78.0 -  100.0 fL   MCH 30.4 26.0 - 34.0 pg   MCHC 32.6 30.0 - 36.0 g/dL   RDW 16.114.1 09.611.5 - 04.515.5 %   Platelets 421 (H) 150 - 400 K/uL  Glucose, capillary  Result Value Ref Range   Glucose-Capillary 168 (H) 65 - 99 mg/dL  Glucose, capillary  Result Value Ref Range   Glucose-Capillary 170 (H) 65 - 99 mg/dL  Glucose, capillary  Result Value Ref Range   Glucose-Capillary 215 (H) 65 - 99 mg/dL   Comment 1 Repeat Test    Comment 2 Document in Chart   Glucose, capillary  Result Value Ref Range   Glucose-Capillary 162 (H) 65 - 99 mg/dL  Protime-INR  Result Value Ref Range   Prothrombin Time 28.5 (H) 11.4 - 15.2 seconds   INR 2.61     Glucose, capillary  Result Value Ref Range   Glucose-Capillary 172 (H) 65 - 99 mg/dL  Glucose, capillary  Result Value Ref Range   Glucose-Capillary 157 (H) 65 - 99 mg/dL  Glucose, capillary  Result Value Ref Range   Glucose-Capillary 199 (H) 65 - 99 mg/dL   Comment 1 Notify RN    Comment 2 Document in Chart   Glucose, capillary  Result Value Ref Range   Glucose-Capillary 139 (H) 65 - 99 mg/dL  Protime-INR  Result Value Ref Range   Prothrombin Time 27.5 (H) 11.4 - 15.2 seconds   INR 2.50   Glucose, capillary  Result Value Ref Range   Glucose-Capillary 212 (H) 65 - 99 mg/dL  Glucose, capillary  Result Value Ref Range   Glucose-Capillary 154 (H) 65 - 99 mg/dL  Glucose, capillary  Result Value Ref Range   Glucose-Capillary 208 (H) 65 - 99 mg/dL  Glucose, capillary  Result Value Ref Range   Glucose-Capillary 189 (H) 65 - 99 mg/dL   Comment 1 Notify RN    Comment 2 Document in Chart   Protime-INR  Result Value Ref Range   Prothrombin Time 25.5 (H) 11.4 - 15.2 seconds   INR 2.27   Glucose, capillary  Result Value Ref Range   Glucose-Capillary 184 (H) 65 - 99 mg/dL  Glucose, capillary  Result Value Ref Range   Glucose-Capillary 176 (H) 65 - 99 mg/dL  VAS US CAROTID  Result Value Ref Range   Right CCA prox sys 112 cm/s   Right CCA prox dias 17 cm/s   Right cca dist sys -61 cm/s   Left CCA prox sys 115 cm/s   Left CCA prox dias 19 cm/s   Left CCA dist sys 93 cm/s   Left CCA dist dias 16 cm/s   Left ICA prox sys -68 cm/s   Left ICA prox dias -17 cm/s   Left ICA dist sys -83 cm/s   Left ICA dist dias -25 cm/s   RIGHT ECA DIAS -6.00 cm/s   RIGHT VERTEBRAL DIAS 16.00 cm/s   LEFT ECA DIAS -5.00 cm/s   LEFT VERTEBRAL DIAS -14.00 cm/s    Discharge Medications:     Medication List    TAKE these medications   acetaminophen 500 MG tablet Commonly known as:  TYLENOL Take 1,000 mg by mouth 2 (two) times daily.   aspirin EC 81 MG tablet Take 81 mg by mouth at  bedtime.   atorvastatin 80 MG tablet Commonly known as:  LIPITOR Take 80 mg by mouth daily.   BESIVANCE 0.6 % Susp Generic drug:  Besifloxacin HCl Place 1 drop into the left eye 3 (three)  times daily.   DUREZOL 0.05 % Emul Generic drug:  Difluprednate Place 1 drop into the left eye 2 (two) times daily. For 1 week then go to 1 drop into left eye once daily for 1 week   glipiZIDE 10 MG tablet Commonly known as:  GLUCOTROL Take 10 mg by mouth 2 (two) times daily.   linagliptin 5 MG Tabs tablet Commonly known as:  TRADJENTA Take 5 mg by mouth daily.   lisinopril 20 MG tablet Commonly known as:  PRINIVIL,ZESTRIL Take 10 mg by mouth at bedtime.   loratadine 10 MG tablet Commonly known as:  CLARITIN Take 10 mg by mouth daily.   lurasidone 40 MG Tabs tablet Commonly known as:  LATUDA Take 40 mg by mouth at bedtime.   methocarbamol 500 MG tablet Commonly known as:  ROBAXIN Take 1 tablet (500 mg total) by mouth 3 (three) times daily as needed.   omega-3 acid ethyl esters 1 g capsule Commonly known as:  LOVAZA Take 2 g by mouth 2 (two) times daily.   omeprazole 20 MG capsule Commonly known as:  PRILOSEC Take 20 mg by mouth at bedtime.   oxyCODONE-acetaminophen 5-325 MG tablet Commonly known as:  PERCOCET/ROXICET Take 1 tablet by mouth every 4 (four) hours as needed for severe pain. What changed:  Another medication with the same name was added. Make sure you understand how and when to take each.   oxyCODONE-acetaminophen 5-325 MG tablet Commonly known as:  ROXICET Take 1-2 tablets by mouth every 4 (four) hours as needed for severe pain. What changed:  You were already taking a medication with the same name, and this prescription was added. Make sure you understand how and when to take each.   PROLENSA 0.07 % Soln Generic drug:  Bromfenac Sodium Place 1 drop into the left eye at bedtime.   tiZANidine 4 MG tablet Commonly known as:  ZANAFLEX Take 2 mg by mouth at  bedtime.   venlafaxine XR 150 MG 24 hr capsule Commonly known as:  EFFEXOR-XR Take 150 mg by mouth 2 (two) times daily. Morning and noon   Vitamin D3 2000 units capsule Take 2,000 Units by mouth daily.   warfarin 5 MG tablet Commonly known as:  COUMADIN Take 1 tablet (5 mg total) by mouth daily. Take as directed per the pharmacist for INR target from 2.5-3.0 for 30 days post op       Diagnostic Studies: Dg Chest 2 View  Result Date: 10/18/2015 CLINICAL DATA:  Acute onset of fever and knee pain. Initial encounter. EXAM: CHEST  2 VIEW COMPARISON:  None. FINDINGS: The lungs are well-aerated. Mild vascular congestion is noted. Mildly increased left-sided interstitial markings and left basilar opacity may reflect mild asymmetric interstitial edema or possibly pneumonia. There is no evidence of pleural effusion or pneumothorax. The heart is normal in size; the mediastinal contour is within normal limits. No acute osseous abnormalities are seen. IMPRESSION: Mild vascular congestion noted. Mildly increased left-sided interstitial markings and left basilar opacity may reflect mild asymmetric interstitial edema or possibly pneumonia. Electronically Signed   By: Roanna Raider M.D.   On: 10/18/2015 03:30   Mr Maxine Glenn Head Wo Contrast  Result Date: 10/16/2015 CLINICAL DATA:  Brief episode of loss of consciousness followed by a right-sided facial droop, slurred speech, and right arm weakness. Recent total knee arthroplasty. EXAM: MRI HEAD WITHOUT CONTRAST MRA HEAD WITHOUT CONTRAST TECHNIQUE: Multiplanar, multiecho pulse sequences of the brain and surrounding structures were obtained without intravenous contrast.  Angiographic images of the head were obtained using MRA technique without contrast. COMPARISON:  Head CT 10/16/2015 FINDINGS: MRI HEAD FINDINGS Brain: There is no evidence of acute infarct, intracranial hemorrhage, mass, midline shift, or extra-axial fluid collection. The ventricles and sulci are within  normal limits for age. Scattered T2 hyperintensities in the subcortical and deep cerebral white matter and pons are nonspecific but compatible with mild chronic small vessel ischemic disease. Vascular: Major intracranial vascular flow voids are preserved. Skull and upper cervical spine: Unremarkable bone marrow signal. Sinuses/Orbits: Prior bilateral cataract extraction. Left maxillary sinus mucous retention cyst. Clear mastoid air cells. Other: None. MRA HEAD FINDINGS The visualized distal vertebral arteries are widely patent with the left being slightly dominant. Right PICA and left AICA origins are patent. Basilar artery is widely patent. SCA origins are patent. There is a patent right posterior communicating artery. PCAs are patent without evidence of major branch occlusion or significant proximal stenosis. Internal carotid arteries are widely patent from skullbase to carotid termini. ACAs and MCAs are patent without evidence of major branch occlusion or significant proximal stenosis. Left ACA is dominant. No intracranial aneurysm is identified. IMPRESSION: 1. No acute intracranial abnormality. 2. Mild chronic small vessel ischemic disease. 3. Negative head MRA. Electronically Signed   By: Sebastian Ache M.D.   On: 10/16/2015 14:55   Mr Brain Wo Contrast  Result Date: 10/16/2015 CLINICAL DATA:  Brief episode of loss of consciousness followed by a right-sided facial droop, slurred speech, and right arm weakness. Recent total knee arthroplasty. EXAM: MRI HEAD WITHOUT CONTRAST MRA HEAD WITHOUT CONTRAST TECHNIQUE: Multiplanar, multiecho pulse sequences of the brain and surrounding structures were obtained without intravenous contrast. Angiographic images of the head were obtained using MRA technique without contrast. COMPARISON:  Head CT 10/16/2015 FINDINGS: MRI HEAD FINDINGS Brain: There is no evidence of acute infarct, intracranial hemorrhage, mass, midline shift, or extra-axial fluid collection. The ventricles  and sulci are within normal limits for age. Scattered T2 hyperintensities in the subcortical and deep cerebral white matter and pons are nonspecific but compatible with mild chronic small vessel ischemic disease. Vascular: Major intracranial vascular flow voids are preserved. Skull and upper cervical spine: Unremarkable bone marrow signal. Sinuses/Orbits: Prior bilateral cataract extraction. Left maxillary sinus mucous retention cyst. Clear mastoid air cells. Other: None. MRA HEAD FINDINGS The visualized distal vertebral arteries are widely patent with the left being slightly dominant. Right PICA and left AICA origins are patent. Basilar artery is widely patent. SCA origins are patent. There is a patent right posterior communicating artery. PCAs are patent without evidence of major branch occlusion or significant proximal stenosis. Internal carotid arteries are widely patent from skullbase to carotid termini. ACAs and MCAs are patent without evidence of major branch occlusion or significant proximal stenosis. Left ACA is dominant. No intracranial aneurysm is identified. IMPRESSION: 1. No acute intracranial abnormality. 2. Mild chronic small vessel ischemic disease. 3. Negative head MRA. Electronically Signed   By: Sebastian Ache M.D.   On: 10/16/2015 14:55   Dg Knee Left Port  Result Date: 10/14/2015 CLINICAL DATA:  Left total knee replacement. EXAM: PORTABLE LEFT KNEE - 1-2 VIEW COMPARISON:  None. FINDINGS: Two-view show total knee arthroplasty with components well positioned. No radiographically detectable complication. Air in the joint and soft tissues as expected. IMPRESSION: Good appearance following total knee arthroplasty. Electronically Signed   By: Paulina Fusi M.D.   On: 10/14/2015 15:40   Ct Head Code Stroke Wo Contrast  Result Date: 10/16/2015 CLINICAL  DATA:  Code stroke. Slurred speech and right-sided facial droop. EXAM: CT HEAD WITHOUT CONTRAST TECHNIQUE: Contiguous axial images were obtained  from the base of the skull through the vertex without intravenous contrast. COMPARISON:  None. FINDINGS: Brain: There is no evidence of acute cortical infarct, intracranial hemorrhage, mass, midline shift, or extra-axial fluid collection. There is mild generalized cerebral atrophy. Vascular: No hyperdense vessel or unexpected calcification. Skull: No focal osseous lesion or fracture. Sinuses/Orbits: Left maxillary sinus mucous retention cyst or polyp. Prior bilateral cataract extraction. Other: None. ASPECTS [facility] Stroke Program Early CT Score) - Ganglionic level infarction (caudate, lentiform nuclei, internal capsule, insula, M1-M3 cortex): 7 - Supraganglionic infarction (M4-M6 cortex): 3 Total score (0-10 with 10 being normal): 10 IMPRESSION: 1. No evidence of acute intracranial abnormality. 2. ASPECTS is 10. These results were called by telephone at the time of interpretation on 10/16/2015 at 10:03 am to Dr. Ritta Slot , who verbally acknowledged these results. Electronically Signed   By: Sebastian Ache M.D.   On: 10/16/2015 10:05    Disposition: SNF - [facility]  INR target 2.5-3.0 on coumadin for 30 days post op  Will need to see Dr Ranell Patrick in one week in the office - please arrange transport    Follow-up Information    Toron Bowring,STEVEN R, MD. Call in 1 week(s).   Specialty:  Orthopedic Surgery Why:  [phone] Contact information: [address] [place] [address] [phone]            Signed: Verlee Rossetti 10/26/2015, 7:50 AM

## 2015-10-26 NOTE — Clinical Social Work Note (Signed)
Patient will discharge today per MD order. [facility] [place] [facility] [place] [facility] to transportation to: [phone] [facility]  CSW sent discharge summary to SNF for review.    Vickii PennaGina Alok Minshall, MSW, LCSW  [phone]  Licensed Clinical Social Worker

## 2015-10-26 NOTE — Clinical Social Work Placement (Signed)
   CLINICAL SOCIAL WORK PLACEMENT  NOTE  Date:  10/26/2015  Patient Details  Name: Devon SangerFred Galster MRN: [id number] Date of Birth: 02/24/1945  Clinical Social Work is seeking post-discharge placement for this patient at the Skilled  Nursing Facility level of care (*CSW will initial, date and re-position this form in  chart as items are completed):  Yes   Patient/family provided with [facility] Clinical Social Work Department's list of facilities offering this level of care within the geographic area requested by the patient (or if unable, by the patient's family).  Yes   Patient/family informed of their freedom to choose among providers that offer the needed level of care, that participate in Medicare, Medicaid or managed care program needed by the patient, have an available bed and are willing to accept the patient.  Yes   Patient/family informed of [facility]'s ownership interest in [facility] and [facility], as well as of the fact that they are under no obligation to receive care at these facilities.  PASRR submitted to EDS on 10/19/15     PASRR number received on 10/19/15     Existing PASRR number confirmed on       FL2 transmitted to all facilities in geographic area requested by pt/family on       FL2 transmitted to all facilities within larger geographic area on       Patient informed that his/her managed care company has contracts with or will negotiate with certain facilities, including the following:        No   Patient/family informed of bed offers received.  Patient chooses bed at  [facility] [facility]     Physician recommends and patient chooses bed at      Patient to be transferred to  [facility]([place] [facility]) on 10/26/15.  Patient to be transferred to facility by [facility] arranged transportation     Patient family notified on 10/26/15 of transfer.  Name of family member notified:  wife and patient updated at bedside     PHYSICIAN Please prepare priority discharge  summary, including medications     Additional Comment:    _______________________________________________ Rondel BatonIngle, Tieisha Darden C, LCSW 10/26/2015, 10:40 AM

## 2015-10-26 NOTE — Progress Notes (Signed)
Orthopedics Progress Note  Subjective: Feeling better and ready for rehab  Objective:  Vitals:   10/25/15 2149 10/26/15 0450  BP: 122/60 121/70  Pulse: 81 71  Resp: 18 17  Temp: 98.1 F (36.7 C) 98 F (36.7 C)    General: Awake and alert  Musculoskeletal: left knee wound CDI, no erythema, min to mod swelling Neurovascularly intact  Lab Results  Component Value Date   WBC 15.5 (H) 10/23/2015   HGB 13.4 10/23/2015   HCT 41.1 10/23/2015   MCV 93.2 10/23/2015   PLT 421 (H) 10/23/2015       Component Value Date/Time   NA 132 (L) 10/18/2015 0304   K 3.9 10/18/2015 0304   CL 101 10/18/2015 0304   CO2 23 10/18/2015 0304   GLUCOSE 171 (H) 10/18/2015 0304   BUN 17 10/18/2015 0304   CREATININE 1.30 (H) 10/21/2015 0515   CALCIUM 8.9 10/18/2015 0304   GFRNONAA 54 (L) 10/21/2015 0515   GFRAA >60 10/21/2015 0515    Lab Results  Component Value Date   INR 2.27 10/26/2015   INR 2.50 10/25/2015   INR 2.61 10/24/2015    Assessment/Plan: POD 12 s/p Procedure(s): LEFT TOTAL KNEE ARTHROPLASTY Pneumonia resolved and patient feeling much better  Ready [facility] [place] this morning Patient has completed Augmentin dose Continue PT, OT, DVT prophylaxis and pulmonary toilet Almedia BallsSteven R. Ranell PatrickNorris, MD 10/26/2015 7:44 AM

## 2015-10-26 NOTE — Progress Notes (Signed)
Report called to Malva Limesheryl Brown, RN at the [facility] [place]. All questions and concerns addressed. [facility] has the correct phone number to call 5N if she has any questions [place] [facility]. Patient due to leave with [facility] transportation at noon today. Will continue to monitor. Asher Muir- Winta Barcelo,RN

## 2015-10-26 NOTE — Progress Notes (Signed)
Physical Therapy Treatment Patient Details Name: Devon Williams MRN: [id number] DOB: 11/01/1945 Today's Date: 10/26/2015    History of Present Illness Patient admitted for elective TKR on left.  PMH significant for DM, chronic kidney disease    PT Comments    Pt performed increased mobility and tolerated seated exercises.  [facility].    Follow Up Recommendations  SNF     Equipment Recommendations  Rolling walker with 5" wheels    Recommendations for Other Services       Precautions / Restrictions Precautions Precautions: Knee;Fall Precaution Booklet Issued: No Required Braces or Orthoses: Knee Immobilizer - Left Knee Immobilizer - Left: Discontinue once straight leg raise with < 10 degree lag Restrictions Weight Bearing Restrictions: Yes LLE Weight Bearing: Weight bearing as tolerated    Mobility  Bed Mobility Overal bed mobility: Needs Assistance Bed Mobility: Supine to Sit     Supine to sit: Supervision     General bed mobility comments: Good technique.  Cues for hand placement, remains to use rail for support.    Transfers Overall transfer level: Needs assistance Equipment used: Rolling walker (2 wheeled)   Sit to Stand: Min guard         General transfer comment: Cues for hand placement and assist to donn lower body dressing.  Ambulation/Gait Ambulation/Gait assistance: Min guard Ambulation Distance (Feet): 200 Feet Assistive device: Rolling walker (2 wheeled) Gait Pattern/deviations: Step-to pattern;Decreased stride length;Shuffle;Antalgic Gait velocity: decreased   General Gait Details: Cues for sequeing, decreased stride length and foot clearance on R side.  Cues for upper trunk/head control.     Stairs            Wheelchair Mobility    Modified Rankin (Stroke Patients Only)       Balance Overall balance assessment: Needs assistance   Sitting balance-Leahy Scale: Good       Standing balance-Leahy Scale:  Fair                      Cognition Arousal/Alertness: Awake/alert Behavior During Therapy: WFL for tasks assessed/performed Overall Cognitive Status: Within Functional Limits for tasks assessed                      Exercises Total Joint Exercises Ankle Circles/Pumps: AROM;Both;10 reps;Supine Quad Sets: AROM;Left;10 reps;Supine Short Arc Quad: AAROM;Left;10 reps;Supine Heel Slides: AAROM;Left;10 reps;Supine Hip ABduction/ADduction: AAROM;Left;10 reps;Supine Long Arc Quad: AAROM;Strengthening;Left;10 reps Goniometric ROM: 80 degrees in L knee.      General Comments        Pertinent Vitals/Pain Pain Assessment: 0-10 Pain Score: 6  Pain Location: L knee Pain Descriptors / Indicators: Discomfort;Grimacing;Sore Pain Intervention(s): Monitored during session;Repositioned    Home Living                      Prior Function            PT Goals (current goals can now be found in the care plan section) Acute Rehab PT Goals Patient Stated Goal: to go to SNFfor rehabilitation Potential to Achieve Goals: Good Progress towards PT goals: Progressing toward goals    Frequency    7X/week      PT Plan Current plan remains appropriate    Co-evaluation             End of Session Equipment Utilized During Treatment: Gait belt Activity Tolerance: Patient tolerated treatment well Patient left: in chair;with family/visitor present;with call  bell/phone within reach     Time: 1103-1126 PT Time Calculation (min) (ACUTE ONLY): 23 min  Charges:  $Gait Training: 8-22 mins $Therapeutic Exercise: 8-22 mins                    G Codes:      Florestine Avers 13-Nov-2015, 12:57 PM  Joycelyn Rua, PTA pager [phone]

## 2015-11-07 LAB — VAS US CAROTID
LEFT ECA DIAS: -5 cm/s
LEFT VERTEBRAL DIAS: -14 cm/s
LICAPDIAS: -17 cm/s
Left CCA dist dias: 16 cm/s
Left CCA dist sys: 93 cm/s
Left CCA prox dias: 19 cm/s
Left CCA prox sys: 115 cm/s
Left ICA dist dias: -25 cm/s
Left ICA dist sys: -83 cm/s
Left ICA prox sys: -68 cm/s
RCCADSYS: -61 cm/s
RCCAPDIAS: 17 cm/s
RCCAPSYS: 112 cm/s
RIGHT ECA DIAS: -6 cm/s
RIGHT VERTEBRAL DIAS: 16 cm/s

## 2016-01-23 DIAGNOSIS — I499 Cardiac arrhythmia, unspecified: Secondary | ICD-10-CM

## 2016-01-23 HISTORY — DX: Cardiac arrhythmia, unspecified: I49.9

## 2019-01-20 NOTE — H&P (Signed)
Patient's anticipated LOS is less than 2 midnights, meeting these requirements: - Younger than 82 - Lives within 1 hour of care - Has a competent adult at home to recover with post-op recover - NO history of  - Chronic pain requiring opiods  - Diabetes  - Coronary Artery Disease  - Heart failure  - Heart attack  - Stroke  - DVT/VTE  - Cardiac arrhythmia  - Respiratory Failure/COPD  - Renal failure  - Anemia  - Advanced Liver disease       Devon Williams is an 73 y.o. male.    Chief Complaint: right shoulder pain  HPI: Pt is a 73 y.o. male complaining of right shoulder pain for multiple years. Pain had continually increased since the beginning. X-rays in the clinic show end-stage arthritic changes of the right shoulder. Pt has tried various conservative treatments which have failed to alleviate their symptoms, including injections and therapy. Various options are discussed with the patient. Risks, benefits and expectations were discussed with the patient. Patient understand the risks, benefits and expectations and wishes to proceed with surgery.   PCP:  Derrill Center., MD  D/C Plans: Home  PMH: Past Medical History:  Diagnosis Date  . Arthritis   . Chronic kidney disease   . Diabetes mellitus without complication ([place])   . GERD (gastroesophageal reflux disease)   . Hypertension     PSH: Past Surgical History:  Procedure Laterality Date  . EYE SURGERY Left    cataract-to have rt done 09/04/15  . GANGLION CYST EXCISION Right    hand  . KNEE ARTHROSCOPY Left    x3  . TOTAL KNEE ARTHROPLASTY Left 10/14/2015   Procedure: LEFT TOTAL KNEE ARTHROPLASTY;  Surgeon: Netta Cedars, MD;  Location: [facility];  Service: Orthopedics;  Laterality: Left;    Social History:  reports that he quit smoking about 42 years ago. His smoking use included cigarettes. He smoked 1.00 pack per day. He quit smokeless tobacco use about 9 years ago. He reports that he does not drink alcohol or use  drugs.  Allergies:  Allergies  Allergen Reactions  . Nabumetone Other (See Comments)    UNSPECIFIED REACTION     Medications: No current facility-administered medications for this encounter.   Current Outpatient Medications  Medication Sig Dispense Refill  . acetaminophen (TYLENOL) 500 MG tablet Take 1,000 mg by mouth 2 (two) times daily.    Marland Kitchen aspirin EC 81 MG tablet Take 81 mg by mouth at bedtime.    Marland Kitchen atorvastatin (LIPITOR) 80 MG tablet Take 80 mg by mouth daily.    Marland Kitchen BESIVANCE 0.6 % SUSP Place 1 drop into the left eye 3 (three) times daily.  1  . Cholecalciferol (VITAMIN D3) 2000 units capsule Take 2,000 Units by mouth daily.    . DUREZOL 0.05 % EMUL Place 1 drop into the left eye 2 (two) times daily. For 1 week then go to 1 drop into left eye once daily for 1 week  1  . glipiZIDE (GLUCOTROL) 10 MG tablet Take 10 mg by mouth 2 (two) times daily.    Marland Kitchen linagliptin (TRADJENTA) 5 MG TABS tablet Take 5 mg by mouth daily.    Marland Kitchen lisinopril (PRINIVIL,ZESTRIL) 20 MG tablet Take 10 mg by mouth at bedtime.    Marland Kitchen loratadine (CLARITIN) 10 MG tablet Take 10 mg by mouth daily.    Marland Kitchen lurasidone (LATUDA) 40 MG TABS tablet Take 40 mg by mouth at bedtime.    . methocarbamol (  ROBAXIN) 500 MG tablet Take 1 tablet (500 mg total) by mouth 3 (three) times daily as needed. 60 tablet 1  . omega-3 acid ethyl esters (LOVAZA) 1 g capsule Take 2 g by mouth 2 (two) times daily.    Marland Kitchen omeprazole (PRILOSEC) 20 MG capsule Take 20 mg by mouth at bedtime.    Marland Kitchen oxyCODONE-acetaminophen (PERCOCET/ROXICET) 5-325 MG tablet Take 1 tablet by mouth every 4 (four) hours as needed for severe pain.    Marland Kitchen oxyCODONE-acetaminophen (ROXICET) 5-325 MG tablet Take 1-2 tablets by mouth every 4 (four) hours as needed for severe pain. 60 tablet 0  . PROLENSA 0.07 % SOLN Place 1 drop into the left eye at bedtime.  1  . tiZANidine (ZANAFLEX) 4 MG tablet Take 2 mg by mouth at bedtime.    Marland Kitchen venlafaxine XR (EFFEXOR-XR) 150 MG 24 hr capsule Take  150 mg by mouth 2 (two) times daily. Morning and noon    . warfarin (COUMADIN) 5 MG tablet Take 1 tablet (5 mg total) by mouth daily. Take as directed per the pharmacist for INR target from 2.5-3.0 for 30 days post op 40 tablet 0    No results found for this or any previous visit (from the past 48 hour(s)). No results found.  ROS: Pain with rom of the right upper extremity  Physical Exam: Alert and oriented 73 y.o. male in no acute distress Cranial nerves 2-12 intact Cervical spine: full rom with no tenderness, nv intact distally Chest: active breath sounds bilaterally, no wheeze rhonchi or rales Heart: regular rate and rhythm, no murmur Abd: non tender non distended with active bowel sounds Hip is stable with rom  Right shoulder with limited rom due to pain  nv intact distally Weakness with ER and IR  Assessment/Plan Assessment: right shoulder cuff arthropathy  Plan:  Patient will undergo a right reverse total shoulder by Dr. Ranell Patrick at [facility]. Risks benefits and expectations were discussed with the patient. Patient understand risks, benefits and expectations and wishes to proceed. Preoperative templating of the joint replacement has been completed, documented, and submitted to the Operating Room personnel in order to optimize intra-operative equipment management.   Alphonsa Overall PA-C, MPAS [facility] is now [employer] [address], [address], [place], [place] [address] Phone: [phone] [email or web address] Facebook  [employer]

## 2019-02-10 ENCOUNTER — Other Ambulatory Visit (HOSPITAL_COMMUNITY)
Admission: RE | Admit: 2019-02-10 | Discharge: 2019-02-10 | Disposition: A | Discharge status: Home / Self Care | Payer: No Typology Code available for payment source | Source: Ambulatory Visit | Attending: Orthopedic Surgery | Admitting: Orthopedic Surgery

## 2019-02-10 DIAGNOSIS — Z20822 Contact with and (suspected) exposure to covid-19: Secondary | ICD-10-CM | POA: Insufficient documentation

## 2019-02-10 DIAGNOSIS — Z01812 Encounter for preprocedural laboratory examination: Secondary | ICD-10-CM | POA: Insufficient documentation

## 2019-02-11 LAB — NOVEL CORONAVIRUS, NAA (HOSP ORDER, SEND-OUT TO REF LAB; TAT 18-24 HRS): SARS-CoV-2, NAA: NOT DETECTED

## 2019-02-11 NOTE — Patient Instructions (Addendum)
DUE TO COVID-19 ONLY ONE VISITOR IS ALLOWED TO COME WITH YOU AND STAY IN THE WAITING ROOM ONLY DURING PRE OP AND PROCEDURE DAY OF SURGERY. THE 1 VISITOR MAY VISIT WITH YOU AFTER SURGERY IN YOUR PRIVATE ROOM DURING VISITING HOURS ONLY!  YOU NEED TO HAVE A COVID 19 TEST ON_______ @_______ , THIS TEST MUST BE DONE BEFORE SURGERY, COME  [address], [place] , .  [facility]) ONCE YOUR COVID TEST IS COMPLETED, PLEASE BEGIN THE QUARANTINE INSTRUCTIONS AS OUTLINED IN YOUR HANDOUT.                Devon Williams    Your procedure is scheduled on: 02/13/19   Report to [facility] Main  Entrance   Report to admitting at  7:00 AM     Call this number if you have problems the morning of surgery [phone]    BRUSH YOUR TEETH MORNING OF SURGERY AND RINSE YOUR MOUTH OUT, NO CHEWING GUM CANDY OR MINTS.   Do not eat food After Midnight  . YOU MAY HAVE CLEAR LIQUIDS FROM MIDNIGHT UNTIL 6:30 AM.    At 6:30  AM Please finish the prescribed Pre-Surgery Gatorade drink.    Nothing by mouth after you finish the Gatorade drink !   Take these medicines the morning of surgery with A SIP OF WATER: Gabapentin, Effexor, Claritin, Prilosec, Use your eye drops.  DO NOT TAKE ANY DIABETIC MEDICATIONS DAY OF YOUR SURGERY   How to Manage Your Diabetes Before and After Surgery  Why is it important to control my blood sugar before and after surgery? . Improving blood sugar levels before and after surgery helps healing and can limit problems. . A way of improving blood sugar control is eating a healthy diet by: o  Eating less sugar and carbohydrates o  Increasing activity/exercise o  Talking with your doctor about reaching your blood sugar goals . High blood sugars (greater than 180 mg/dL) can raise your risk of infections and slow your recovery, so you will need to focus on controlling your diabetes during the weeks before surgery. . Make sure that the doctor who takes care  of your diabetes knows about your planned surgery including the date and location.  How do I manage my blood sugar before surgery? . Check your blood sugar at least 4 times a day, starting 2 days before surgery, to make sure that the level is not too high or low. o Check your blood sugar the morning of your surgery when you wake up and every 2 hours until you get to the Short Stay unit. . If your blood sugar is less than 70 mg/dL, you will need to treat for low blood sugar: o Do not take insulin. o Treat a low blood sugar (less than 70 mg/dL) with  cup of clear juice (cranberry or apple), 4 glucose tablets, OR glucose gel. o Recheck blood sugar in 15 minutes after treatment (to make sure it is greater than 70 mg/dL). If your blood sugar is not greater than 70 mg/dL on recheck, call [phone] for further instructions. . Report your blood sugar to the short stay nurse when you get to Short Stay.  . If you are admitted to the hospital after surgery: o Your blood sugar will be checked by the staff and you will probably be given insulin after surgery (instead of oral diabetes medicines) to make sure you have good blood sugar levels. o The goal for blood sugar  control after surgery is 80-180 mg/dL.   WHAT DO I DO ABOUT MY DIABETES MEDICATION?  Marland Kitchen Do not take oral diabetes medicines (pills) the morning of surgery.                     You may not have any metal on your body including              piercings  Do not wear jewelry, lotions, powders or  deodorant                      Men may shave face and neck.   Do not bring valuables to the hospital. [place].  Contacts, dentures or bridgework may not be worn into surgery.       Patients discharged the day of surgery will not be allowed to drive home.  IF YOU ARE HAVING SURGERY AND GOING HOME THE SAME DAY, YOU MUST HAVE AN ADULT TO DRIVE YOU HOME AND BE WITH YOU FOR 24 HOURS.  YOU MAY GO  HOME BY TAXI OR UBER OR ORTHERWISE, BUT AN ADULT MUST ACCOMPANY YOU HOME AND STAY WITH YOU FOR 24 HOURS.  Name and phone number of your driver:  Special Instructions: N/A              Please read over the following fact sheets you were given: _____________________________________________________________________             [facility] - Preparing for Surgery  Before surgery, you can play an important role.   Because skin is not sterile, your skin needs to be as free of germs as possible.   You can reduce the number of germs on your skin by washing with CHG (chlorahexidine gluconate) soap before surgery.   CHG is an antiseptic cleaner which kills germs and bonds with the skin to continue killing germs even after washing. Please DO NOT use if you have an allergy to CHG or antibacterial soaps .  If your skin becomes reddened/irritated stop using the CHG and inform your nurse when you arrive at Short Stay. .  You may shave your face/neck.  Please follow these instructions carefully:  1.  Shower with CHG Soap the night before surgery and the  morning of Surgery.  2.  If you choose to wash your hair, wash your hair first as usual with your  normal  shampoo.  3.  After you shampoo, rinse your hair and body thoroughly to remove the  shampoo.                                        4.  Use CHG as you would any other liquid soap.  You can apply chg directly  to the skin and wash                       Gently with a scrungie or clean washcloth.  5.  Apply the CHG Soap to your body ONLY FROM THE NECK DOWN.   Do not use on face/ open                           Wound or open sores. Avoid contact with eyes,  ears mouth and genitals (private parts).                       Wash face,  Genitals (private parts) with your normal soap.             6.  Wash thoroughly, paying special attention to the area where your surgery  will be performed.  7.  Thoroughly rinse your body with warm water from the neck  down.  8.  DO NOT shower/wash with your normal soap after using and rinsing off  the CHG Soap.             9.  Pat yourself dry with a clean towel.            10.  Wear clean pajamas.            11.  Place clean sheets on your bed the night of your first shower and do not  sleep with pets. Day of Surgery : Do not apply any lotions/deodorants the morning of surgery.  Please wear clean clothes to the hospital/surgery center.  FAILURE TO FOLLOW THESE INSTRUCTIONS MAY RESULT IN THE CANCELLATION OF YOUR SURGERY PATIENT SIGNATURE_________________________________  NURSE SIGNATURE__________________________________  ________________________________________________________________________   Rogelia Mire  An incentive spirometer is a tool that can help keep your lungs clear and active. This tool measures how well you are filling your lungs with each breath. Taking long deep breaths may help reverse or decrease the chance of developing breathing (pulmonary) problems (especially infection) following:  A long period of time when you are unable to move or be active. BEFORE THE PROCEDURE   If the spirometer includes an indicator to show your best effort, your nurse or respiratory therapist will set it to a desired goal.  If possible, sit up straight or lean slightly forward. Try not to slouch.  Hold the incentive spirometer in an upright position. INSTRUCTIONS FOR USE  1. Sit on the edge of your bed if possible, or sit up as far as you can in bed or on a chair. 2. Hold the incentive spirometer in an upright position. 3. Breathe out normally. 4. Place the mouthpiece in your mouth and seal your lips tightly around it. 5. Breathe in slowly and as deeply as possible, raising the piston or the ball toward the top of the column. 6. Hold your breath for 3-5 seconds or for as long as possible. Allow the piston or ball to fall to the bottom of the column. 7. Remove the mouthpiece from your mouth  and breathe out normally. 8. Rest for a few seconds and repeat Steps 1 through 7 at least 10 times every 1-2 hours when you are awake. Take your time and take a few normal breaths between deep breaths. 9. The spirometer may include an indicator to show your best effort. Use the indicator as a goal to work toward during each repetition. 10. After each set of 10 deep breaths, practice coughing to be sure your lungs are clear. If you have an incision (the cut made at the time of surgery), support your incision when coughing by placing a pillow or rolled up towels firmly against it. Once you are able to get out of bed, walk around indoors and cough well. You may stop using the incentive spirometer when instructed by your caregiver.  RISKS AND COMPLICATIONS  Take your time so you do not get dizzy or light-headed.  If you are in pain, you  may need to take or ask for pain medication before doing incentive spirometry. It is harder to take a deep breath if you are having pain. AFTER USE  Rest and breathe slowly and easily.  It can be helpful to keep track of a log of your progress. Your caregiver can provide you with a simple table to help with this. If you are using the spirometer at home, follow these instructions: [place] IF:   You are having difficultly using the spirometer.  You have trouble using the spirometer as often as instructed.  Your pain medication is not giving enough relief while using the spirometer.  You develop fever of 100.5 F (38.1 C) or higher. SEEK IMMEDIATE MEDICAL CARE IF:   You cough up bloody sputum that had not been present before.  You develop fever of 102 F (38.9 C) or greater.  You develop worsening pain at or near the incision site. MAKE SURE YOU:   Understand these instructions.  Will watch your condition.  Will get help right away if you are not doing well or get worse. Document Released: 05/21/2006 Document Revised: 04/02/2011 Document  Reviewed: 07/22/2006 [facility] Patient Information 2014 [place], [place].   ________________________________________________________________________

## 2019-02-12 ENCOUNTER — Encounter (HOSPITAL_COMMUNITY): Payer: Self-pay

## 2019-02-12 ENCOUNTER — Encounter (HOSPITAL_COMMUNITY)
Admission: RE | Admit: 2019-02-12 | Discharge: 2019-02-12 | Disposition: A | Discharge status: Home / Self Care | Payer: No Typology Code available for payment source | Source: Ambulatory Visit | Attending: Orthopedic Surgery | Admitting: Orthopedic Surgery

## 2019-02-12 ENCOUNTER — Other Ambulatory Visit: Payer: Self-pay

## 2019-02-12 DIAGNOSIS — M19011 Primary osteoarthritis, right shoulder: Secondary | ICD-10-CM | POA: Insufficient documentation

## 2019-02-12 DIAGNOSIS — Z87891 Personal history of nicotine dependence: Secondary | ICD-10-CM | POA: Insufficient documentation

## 2019-02-12 DIAGNOSIS — Z01818 Encounter for other preprocedural examination: Secondary | ICD-10-CM | POA: Insufficient documentation

## 2019-02-12 DIAGNOSIS — K219 Gastro-esophageal reflux disease without esophagitis: Secondary | ICD-10-CM | POA: Insufficient documentation

## 2019-02-12 DIAGNOSIS — E1122 Type 2 diabetes mellitus with diabetic chronic kidney disease: Secondary | ICD-10-CM | POA: Insufficient documentation

## 2019-02-12 DIAGNOSIS — Z79899 Other long term (current) drug therapy: Secondary | ICD-10-CM | POA: Insufficient documentation

## 2019-02-12 DIAGNOSIS — Z7901 Long term (current) use of anticoagulants: Secondary | ICD-10-CM | POA: Insufficient documentation

## 2019-02-12 DIAGNOSIS — N189 Chronic kidney disease, unspecified: Secondary | ICD-10-CM | POA: Insufficient documentation

## 2019-02-12 DIAGNOSIS — I480 Paroxysmal atrial fibrillation: Secondary | ICD-10-CM | POA: Insufficient documentation

## 2019-02-12 DIAGNOSIS — Z7984 Long term (current) use of oral hypoglycemic drugs: Secondary | ICD-10-CM | POA: Insufficient documentation

## 2019-02-12 HISTORY — DX: Unspecified atrial fibrillation: I48.91

## 2019-02-12 LAB — CBC
HCT: 44.7 % (ref 39.0–52.0)
Hemoglobin: 14.9 g/dL (ref 13.0–17.0)
MCH: 31.6 pg (ref 26.0–34.0)
MCHC: 33.3 g/dL (ref 30.0–36.0)
MCV: 94.9 fL (ref 80.0–100.0)
Platelets: 283 10*3/uL (ref 150–400)
RBC: 4.71 MIL/uL (ref 4.22–5.81)
RDW: 12.8 % (ref 11.5–15.5)
WBC: 8.9 10*3/uL (ref 4.0–10.5)
nRBC: 0 % (ref 0.0–0.2)

## 2019-02-12 LAB — BASIC METABOLIC PANEL
Anion gap: 8 (ref 5–15)
BUN: 13 mg/dL (ref 8–23)
CO2: 28 mmol/L (ref 22–32)
Calcium: 9.8 mg/dL (ref 8.9–10.3)
Chloride: 102 mmol/L (ref 98–111)
Creatinine, Ser: 1.13 mg/dL (ref 0.61–1.24)
GFR calc Af Amer: >60 mL/min (ref >60)
GFR calc non Af Amer: >60 mL/min (ref >60)
Glucose, Bld: 191 mg/dL — ABNORMAL HIGH (ref 70–99)
Potassium: 4.6 mmol/L (ref 3.5–5.1)
Sodium: 138 mmol/L (ref 135–145)

## 2019-02-12 LAB — HEMOGLOBIN A1C
Hgb A1c MFr Bld: 6.5 % — ABNORMAL HIGH (ref 4.8–5.6)
Mean Plasma Glucose: 139.85 mg/dL

## 2019-02-12 LAB — SURGICAL PCR SCREEN
MRSA, PCR: NEGATIVE
Staphylococcus aureus: POSITIVE — AB

## 2019-02-12 LAB — GLUCOSE, CAPILLARY: Glucose-Capillary: 220 mg/dL — ABNORMAL HIGH (ref 70–99)

## 2019-02-12 NOTE — Anesthesia Preprocedure Evaluation (Addendum)
Anesthesia Evaluation  Patient identified by MRN, date of birth, ID band Patient awake    Reviewed: Allergy & Precautions, NPO status , Patient's Chart, lab work & pertinent test results  Airway Mallampati: I       Dental no notable dental hx. (+) Teeth Intact   Pulmonary former smoker,    Pulmonary exam normal breath sounds clear to auscultation       Cardiovascular Normal cardiovascular exam Rhythm:Regular Rate:Normal     Neuro/Psych    GI/Hepatic GERD  Medicated and Controlled,  Endo/Other  diabetes, Type 2, Oral Hypoglycemic Agents  Renal/GU Renal diseaseCKD III     Musculoskeletal   Abdominal (+) + obese,   Peds  Hematology   Anesthesia Other Findings   Reproductive/Obstetrics                            Anesthesia Physical Anesthesia Plan  ASA: II  Anesthesia Plan: General   Post-op Pain Management:  Regional for Post-op pain   Induction: Intravenous  PONV Risk Score and Plan: 2 and Ondansetron, Treatment may vary due to age or medical condition and Midazolam  Airway Management Planned: Oral ETT  Additional Equipment: None  Intra-op Plan:   Post-operative Plan: Extubation in OR  Informed Consent: I have reviewed the patients History and Physical, chart, labs and discussed the procedure including the risks, benefits and alternatives for the proposed anesthesia with the patient or authorized representative who has indicated his/her understanding and acceptance.     Dental advisory given  Plan Discussed with: CRNA  Anesthesia Plan Comments:        Anesthesia Quick Evaluation

## 2019-02-12 NOTE — Progress Notes (Signed)
PCP - Dr. Raphael Gibney Cardiologist - Dr. Thomasene Ripple  Chest x-ray - 2017 EKG -  Stress Test -  ECHO -  Cardiac Cath -   Sleep Study - yes negative results CPAP - no  Fasting Blood Sugar - 110-130 Checks Blood Sugar _____ times a day 3 times a week  Blood Thinner Instructions:Eliquis Aspirin Instructions:Dr Norris's office . Stop 5 days prior Last Dose:1/18  Anesthesia review:   Patient denies shortness of breath, fever, cough and chest pain at PAT appointment  yes Patient verbalized understanding of instructions that were given to them at the PAT appointment. Patient was also instructed that they will need to review over the PAT instructions again at home before surgery. yes

## 2019-02-12 NOTE — Progress Notes (Addendum)
Anesthesia Chart Review   Case: [id number] Date/Time: 02/13/19 0915   Procedure: REVERSE SHOULDER ARTHROPLASTY (Right Shoulder) - interscalene block   Anesthesia type: General   Pre-op diagnosis: Right shoulder osteoarthritis   Location: [facility]OM 06 / [facility]   Surgeons: Devon Low, MD      DISCUSSION:74 y.o. former smoker (quit 10/03/76) with h/o DM II, CKD, GERD, PAF (on Eliquis), loop recorder in place, right shoulder OA scheduled for above procedure 02/13/19 with Dr. Beverely Williams.    Clearance from PCP on chart which states pt is moderate risk due to DM, age, A-fib.  Advised to hold Eliquis 2 days prior to procedure.  Pt reported to PAT nurse he was told by Dr. Ranell Patrick' office to hold 5 days prior, reports last dose 1/18.   Elevated glucose at PAT visit, A1C 6.5.  Pt reported to PAT nurse fasting blood sugars typically between 110-130. Evaluate DOS.  VS: BP 123/74   Pulse 73   Temp 37 C (Oral)   Resp 16   Ht 5\' 11"  (1.803 m)   Wt 114.2 kg   SpO2 96%   BMI 35.11 kg/m   PROVIDERS: ., MD is PCP   LABS: Labs reviewed: Acceptable for surgery. (all labs ordered are listed, but only abnormal results are displayed)  Labs Reviewed  BASIC METABOLIC PANEL - Abnormal; Notable for the following components:      Result Value   Glucose, Bld 191 (*)    All other components within normal limits  HEMOGLOBIN A1C - Abnormal; Notable for the following components:   Hgb A1c MFr Bld 6.5 (*)    All other components within normal limits  GLUCOSE, CAPILLARY - Abnormal; Notable for the following components:   Glucose-Capillary 220 (*)    All other components within normal limits  SURGICAL PCR SCREEN  CBC     IMAGES:   EKG: 02/12/19 Rate 75 bpm Normal sinus rhythm  Left anterior fascicular block  Cannot rule out inferior infarct, age undetermined   CV: Echo 12/05/2016  SUMMARY The left ventricular size is normal. There is normal left ventricular wall  thickness.  Left ventricular systolic function is normal. LV ejection fraction = 50-55%. The left ventricular wall motion is normal.  Left ventricular filling pattern is indeterminate. The right ventricle is normal in size and function. IVC size was normal. There is no pericardial effusion. There is no comparison study available. Past Medical History:  Diagnosis Date  . Arthritis   . Chronic kidney disease   . Diabetes mellitus without complication (HCC)   . GERD (gastroesophageal reflux disease)     Past Surgical History:  Procedure Laterality Date  . EYE SURGERY Left    cataract-to have rt done 09/04/15  . GANGLION CYST EXCISION Right    hand  . JOINT REPLACEMENT    . KNEE ARTHROSCOPY Left    x3  . TOTAL KNEE ARTHROPLASTY Left 10/14/2015   Procedure: LEFT TOTAL KNEE ARTHROPLASTY;  Surgeon: 10/16/2015, MD;  Location: [facility] OR;  Service: Orthopedics;  Laterality: Left;    MEDICATIONS: . acetaminophen (TYLENOL) 500 MG tablet  . Alogliptin Benzoate 25 MG TABS  . apixaban (ELIQUIS) 5 MG TABS tablet  . Artificial Tear Solution (SOOTHE XP XTRA PROTECTION OP)  . atorvastatin (LIPITOR) 40 MG tablet  . Cholecalciferol (VITAMIN D3) 125 MCG (5000 UT) TABS  . gabapentin (NEURONTIN) 300 MG capsule  . glipiZIDE (GLUCOTROL) 10 MG tablet  . loratadine (CLARITIN) 10 MG tablet  .  metFORMIN (GLUCOPHAGE) 500 MG tablet  . omega-3 acid ethyl esters (LOVAZA) 1 g capsule  . omeprazole (PRILOSEC) 20 MG capsule  . Oxycodone HCl 10 MG TABS  . tamsulosin (FLOMAX) 0.4 MG CAPS capsule  . tiZANidine (ZANAFLEX) 4 MG tablet  . traZODone (DESYREL) 150 MG tablet  . trospium (SANCTURA) 20 MG tablet  . venlafaxine XR (EFFEXOR-XR) 150 MG 24 hr capsule   No current facility-administered medications for this encounter.   Maia Plan [facility] Pre-Surgical Testing [phone] 02/12/19  3:16 PM

## 2019-02-13 ENCOUNTER — Encounter (HOSPITAL_COMMUNITY)
Admission: RE | Disposition: A | Discharge status: Home / Self Care | Payer: Self-pay | Source: Home / Self Care | Attending: Orthopedic Surgery

## 2019-02-13 ENCOUNTER — Other Ambulatory Visit: Payer: Self-pay

## 2019-02-13 ENCOUNTER — Encounter (HOSPITAL_COMMUNITY): Payer: Self-pay | Admitting: Orthopedic Surgery

## 2019-02-13 ENCOUNTER — Inpatient Hospital Stay (HOSPITAL_COMMUNITY): Payer: No Typology Code available for payment source | Admitting: Physician Assistant

## 2019-02-13 ENCOUNTER — Inpatient Hospital Stay (HOSPITAL_COMMUNITY): Payer: No Typology Code available for payment source

## 2019-02-13 ENCOUNTER — Inpatient Hospital Stay (HOSPITAL_COMMUNITY)
Admission: RE | Admit: 2019-02-13 | Discharge: 2019-02-18 | DRG: 483 | Disposition: A | Discharge status: Home Health | Payer: No Typology Code available for payment source | Attending: Orthopedic Surgery | Admitting: Orthopedic Surgery

## 2019-02-13 DIAGNOSIS — E871 Hypo-osmolality and hyponatremia: Secondary | ICD-10-CM | POA: Diagnosis not present

## 2019-02-13 DIAGNOSIS — H9190 Unspecified hearing loss, unspecified ear: Secondary | ICD-10-CM | POA: Diagnosis present

## 2019-02-13 DIAGNOSIS — E1122 Type 2 diabetes mellitus with diabetic chronic kidney disease: Secondary | ICD-10-CM | POA: Diagnosis present

## 2019-02-13 DIAGNOSIS — Z96652 Presence of left artificial knee joint: Secondary | ICD-10-CM | POA: Diagnosis present

## 2019-02-13 DIAGNOSIS — R791 Abnormal coagulation profile: Secondary | ICD-10-CM | POA: Diagnosis not present

## 2019-02-13 DIAGNOSIS — Z79899 Other long term (current) drug therapy: Secondary | ICD-10-CM

## 2019-02-13 DIAGNOSIS — Z96611 Presence of right artificial shoulder joint: Secondary | ICD-10-CM | POA: Diagnosis not present

## 2019-02-13 DIAGNOSIS — Z87891 Personal history of nicotine dependence: Secondary | ICD-10-CM

## 2019-02-13 DIAGNOSIS — Z7901 Long term (current) use of anticoagulants: Secondary | ICD-10-CM | POA: Diagnosis not present

## 2019-02-13 DIAGNOSIS — Z7984 Long term (current) use of oral hypoglycemic drugs: Secondary | ICD-10-CM

## 2019-02-13 DIAGNOSIS — Z20822 Contact with and (suspected) exposure to covid-19: Secondary | ICD-10-CM | POA: Diagnosis present

## 2019-02-13 DIAGNOSIS — F32A Depression, unspecified: Secondary | ICD-10-CM | POA: Diagnosis present

## 2019-02-13 DIAGNOSIS — M19011 Primary osteoarthritis, right shoulder: Secondary | ICD-10-CM | POA: Diagnosis present

## 2019-02-13 DIAGNOSIS — J9601 Acute respiratory failure with hypoxia: Secondary | ICD-10-CM | POA: Diagnosis not present

## 2019-02-13 DIAGNOSIS — N1831 Chronic kidney disease, stage 3a: Secondary | ICD-10-CM | POA: Diagnosis present

## 2019-02-13 DIAGNOSIS — M75101 Unspecified rotator cuff tear or rupture of right shoulder, not specified as traumatic: Secondary | ICD-10-CM | POA: Diagnosis present

## 2019-02-13 DIAGNOSIS — E669 Obesity, unspecified: Secondary | ICD-10-CM | POA: Diagnosis present

## 2019-02-13 DIAGNOSIS — E785 Hyperlipidemia, unspecified: Secondary | ICD-10-CM | POA: Diagnosis present

## 2019-02-13 DIAGNOSIS — Z7982 Long term (current) use of aspirin: Secondary | ICD-10-CM | POA: Diagnosis not present

## 2019-02-13 DIAGNOSIS — D72829 Elevated white blood cell count, unspecified: Secondary | ICD-10-CM | POA: Diagnosis not present

## 2019-02-13 DIAGNOSIS — F0390 Unspecified dementia without behavioral disturbance: Secondary | ICD-10-CM | POA: Diagnosis present

## 2019-02-13 DIAGNOSIS — N400 Enlarged prostate without lower urinary tract symptoms: Secondary | ICD-10-CM | POA: Diagnosis present

## 2019-02-13 DIAGNOSIS — E1129 Type 2 diabetes mellitus with other diabetic kidney complication: Secondary | ICD-10-CM | POA: Diagnosis present

## 2019-02-13 DIAGNOSIS — F329 Major depressive disorder, single episode, unspecified: Secondary | ICD-10-CM | POA: Diagnosis present

## 2019-02-13 DIAGNOSIS — K219 Gastro-esophageal reflux disease without esophagitis: Secondary | ICD-10-CM | POA: Diagnosis present

## 2019-02-13 DIAGNOSIS — I129 Hypertensive chronic kidney disease with stage 1 through stage 4 chronic kidney disease, or unspecified chronic kidney disease: Secondary | ICD-10-CM | POA: Diagnosis present

## 2019-02-13 DIAGNOSIS — E11649 Type 2 diabetes mellitus with hypoglycemia without coma: Secondary | ICD-10-CM | POA: Diagnosis not present

## 2019-02-13 DIAGNOSIS — N183 Chronic kidney disease, stage 3 unspecified: Secondary | ICD-10-CM | POA: Diagnosis present

## 2019-02-13 DIAGNOSIS — I480 Paroxysmal atrial fibrillation: Secondary | ICD-10-CM | POA: Diagnosis present

## 2019-02-13 DIAGNOSIS — I10 Essential (primary) hypertension: Secondary | ICD-10-CM | POA: Diagnosis present

## 2019-02-13 DIAGNOSIS — Z833 Family history of diabetes mellitus: Secondary | ICD-10-CM | POA: Diagnosis not present

## 2019-02-13 DIAGNOSIS — R509 Fever, unspecified: Secondary | ICD-10-CM

## 2019-02-13 DIAGNOSIS — Z6834 Body mass index (BMI) 34.0-34.9, adult: Secondary | ICD-10-CM

## 2019-02-13 DIAGNOSIS — R0902 Hypoxemia: Secondary | ICD-10-CM

## 2019-02-13 HISTORY — PX: REVERSE SHOULDER ARTHROPLASTY: SHX5054

## 2019-02-13 LAB — GLUCOSE, CAPILLARY
Glucose-Capillary: 116 mg/dL — ABNORMAL HIGH (ref 70–99)
Glucose-Capillary: 162 mg/dL — ABNORMAL HIGH (ref 70–99)
Glucose-Capillary: 147 mg/dL — ABNORMAL HIGH (ref 70–99)
Glucose-Capillary: 117 mg/dL — ABNORMAL HIGH (ref 70–99)
Glucose-Capillary: 111 mg/dL — ABNORMAL HIGH (ref 70–99)

## 2019-02-13 SURGERY — ARTHROPLASTY, SHOULDER, TOTAL, REVERSE
Anesthesia: General | Site: Shoulder | Laterality: Right

## 2019-02-13 MED ORDER — ONDANSETRON HCL 4 MG/2ML IJ SOLN: 4.0000 mg | Freq: Once | INTRAMUSCULAR | Status: DC | PRN

## 2019-02-13 MED ORDER — ACETAMINOPHEN 10 MG/ML IV SOLN
1000.0000 mg | Freq: Once | INTRAVENOUS | Status: DC | PRN
  Administered 2019-02-13: 1000 mg via INTRAVENOUS

## 2019-02-13 MED ORDER — FENTANYL CITRATE (PF) 250 MCG/5ML IJ SOLN
INTRAMUSCULAR | Status: DC | PRN
  Administered 2019-02-13 (×2): 50 ug via INTRAVENOUS

## 2019-02-13 MED ORDER — INSULIN ASPART 100 UNIT/ML ~~LOC~~ SOLN: 0.0000 [IU] | Freq: Every day | SUBCUTANEOUS | Status: DC

## 2019-02-13 MED ORDER — OMEGA-3-ACID ETHYL ESTERS 1 G PO CAPS
2.0000 g | ORAL_CAPSULE | Freq: Two times a day (BID) | ORAL | Status: DC
  Administered 2019-02-13 – 2019-02-18 (×10): 2 g via ORAL
  Filled 2019-02-13 (×9): qty 2

## 2019-02-13 MED ORDER — PHENYLEPHRINE 40 MCG/ML (10ML) SYRINGE FOR IV PUSH (FOR BLOOD PRESSURE SUPPORT)
PREFILLED_SYRINGE | INTRAVENOUS | Status: AC
  Filled 2019-02-13: qty 10

## 2019-02-13 MED ORDER — DARIFENACIN HYDROBROMIDE ER 7.5 MG PO TB24
7.5000 mg | ORAL_TABLET | Freq: Every day | ORAL | Status: DC
  Administered 2019-02-14 – 2019-02-18 (×5): 7.5 mg via ORAL
  Filled 2019-02-13 (×5): qty 1

## 2019-02-13 MED ORDER — DOCUSATE SODIUM 100 MG PO CAPS
100.0000 mg | ORAL_CAPSULE | Freq: Two times a day (BID) | ORAL | Status: DC
  Administered 2019-02-13 – 2019-02-16 (×7): 100 mg via ORAL
  Filled 2019-02-13 (×7): qty 1

## 2019-02-13 MED ORDER — ACETAMINOPHEN 325 MG PO TABS: 325.0000 mg | ORAL_TABLET | ORAL | Status: DC | PRN

## 2019-02-13 MED ORDER — GABAPENTIN 300 MG PO CAPS
600.0000 mg | ORAL_CAPSULE | Freq: Every day | ORAL | Status: DC
  Administered 2019-02-13 – 2019-02-17 (×5): 600 mg via ORAL
  Filled 2019-02-13 (×5): qty 2

## 2019-02-13 MED ORDER — SUCCINYLCHOLINE CHLORIDE 200 MG/10ML IV SOSY
PREFILLED_SYRINGE | INTRAVENOUS | Status: DC | PRN
  Administered 2019-02-13: 140 mg via INTRAVENOUS

## 2019-02-13 MED ORDER — LORATADINE 10 MG PO TABS
10.0000 mg | ORAL_TABLET | Freq: Every day | ORAL | Status: DC
  Administered 2019-02-13 – 2019-02-18 (×6): 10 mg via ORAL
  Filled 2019-02-13 (×6): qty 1

## 2019-02-13 MED ORDER — BISACODYL 10 MG RE SUPP: 10.0000 mg | Freq: Every day | RECTAL | Status: DC | PRN

## 2019-02-13 MED ORDER — APIXABAN 5 MG PO TABS
5.0000 mg | ORAL_TABLET | Freq: Two times a day (BID) | ORAL | Status: DC
  Administered 2019-02-14 – 2019-02-18 (×9): 5 mg via ORAL
  Filled 2019-02-13 (×9): qty 1

## 2019-02-13 MED ORDER — PHENYLEPHRINE 40 MCG/ML (10ML) SYRINGE FOR IV PUSH (FOR BLOOD PRESSURE SUPPORT)
PREFILLED_SYRINGE | INTRAVENOUS | Status: DC | PRN
  Administered 2019-02-13: 80 ug via INTRAVENOUS
  Administered 2019-02-13: 40 ug via INTRAVENOUS
  Administered 2019-02-13 (×2): 80 ug via INTRAVENOUS

## 2019-02-13 MED ORDER — SODIUM CHLORIDE 0.9 % IR SOLN
Status: DC | PRN
  Administered 2019-02-13 (×2): 1000 mL

## 2019-02-13 MED ORDER — ACETAMINOPHEN 325 MG PO TABS: 325.0000 mg | ORAL_TABLET | Freq: Four times a day (QID) | ORAL | Status: DC | PRN

## 2019-02-13 MED ORDER — INSULIN ASPART 100 UNIT/ML ~~LOC~~ SOLN
4.0000 [IU] | Freq: Three times a day (TID) | SUBCUTANEOUS | Status: DC
  Administered 2019-02-14 – 2019-02-18 (×6): 4 [IU] via SUBCUTANEOUS

## 2019-02-13 MED ORDER — PANTOPRAZOLE SODIUM 40 MG PO TBEC
40.0000 mg | DELAYED_RELEASE_TABLET | Freq: Every day | ORAL | Status: DC
  Administered 2019-02-13 – 2019-02-18 (×6): 40 mg via ORAL
  Filled 2019-02-13 (×6): qty 1

## 2019-02-13 MED ORDER — GABAPENTIN 300 MG PO CAPS
300.0000 mg | ORAL_CAPSULE | Freq: Two times a day (BID) | ORAL | Status: DC
  Administered 2019-02-14 – 2019-02-18 (×10): 300 mg via ORAL
  Filled 2019-02-13 (×10): qty 1

## 2019-02-13 MED ORDER — ONDANSETRON HCL 4 MG/2ML IJ SOLN: 4.0000 mg | Freq: Four times a day (QID) | INTRAMUSCULAR | Status: DC | PRN

## 2019-02-13 MED ORDER — CEFAZOLIN SODIUM-DEXTROSE 2-4 GM/100ML-% IV SOLN
2.0000 g | INTRAVENOUS | Status: AC
  Administered 2019-02-13: 2 g via INTRAVENOUS
  Filled 2019-02-13: qty 100

## 2019-02-13 MED ORDER — LIDOCAINE 2% (20 MG/ML) 5 ML SYRINGE
INTRAMUSCULAR | Status: AC
  Filled 2019-02-13: qty 5

## 2019-02-13 MED ORDER — OXYCODONE HCL 10 MG PO TABS: 10.0000 mg | ORAL_TABLET | Freq: Two times a day (BID) | ORAL | 0 refills | Status: AC | PRN

## 2019-02-13 MED ORDER — VENLAFAXINE HCL ER 150 MG PO CP24
150.0000 mg | ORAL_CAPSULE | Freq: Two times a day (BID) | ORAL | Status: DC
  Administered 2019-02-13 – 2019-02-18 (×9): 150 mg via ORAL
  Filled 2019-02-13 (×9): qty 1

## 2019-02-13 MED ORDER — MEPERIDINE HCL 50 MG/ML IJ SOLN: 6.2500 mg | INTRAMUSCULAR | Status: DC | PRN

## 2019-02-13 MED ORDER — FENTANYL CITRATE (PF) 100 MCG/2ML IJ SOLN
50.0000 ug | INTRAMUSCULAR | Status: DC
  Administered 2019-02-13: 100 ug via INTRAVENOUS
  Filled 2019-02-13: qty 2

## 2019-02-13 MED ORDER — ALBUTEROL SULFATE HFA 108 (90 BASE) MCG/ACT IN AERS
INHALATION_SPRAY | RESPIRATORY_TRACT | Status: AC
  Filled 2019-02-13: qty 6.7

## 2019-02-13 MED ORDER — CEFAZOLIN SODIUM-DEXTROSE 2-4 GM/100ML-% IV SOLN
2.0000 g | Freq: Four times a day (QID) | INTRAVENOUS | Status: AC
  Administered 2019-02-13 – 2019-02-14 (×3): 2 g via INTRAVENOUS
  Filled 2019-02-13 (×3): qty 100

## 2019-02-13 MED ORDER — VITAMIN D 25 MCG (1000 UNIT) PO TABS
5000.0000 [IU] | ORAL_TABLET | Freq: Every day | ORAL | Status: DC
  Administered 2019-02-14 – 2019-02-18 (×5): 5000 [IU] via ORAL
  Filled 2019-02-13 (×5): qty 5

## 2019-02-13 MED ORDER — HYDROMORPHONE HCL 1 MG/ML IJ SOLN: 0.5000 mg | INTRAMUSCULAR | Status: DC | PRN

## 2019-02-13 MED ORDER — ACETAMINOPHEN 10 MG/ML IV SOLN
INTRAVENOUS | Status: AC
  Filled 2019-02-13: qty 100

## 2019-02-13 MED ORDER — TAMSULOSIN HCL 0.4 MG PO CAPS
0.4000 mg | ORAL_CAPSULE | Freq: Every day | ORAL | Status: DC
  Administered 2019-02-13 – 2019-02-17 (×4): 0.4 mg via ORAL
  Filled 2019-02-13 (×4): qty 1

## 2019-02-13 MED ORDER — BUPIVACAINE LIPOSOME 1.3 % IJ SUSP
INTRAMUSCULAR | Status: DC | PRN
  Administered 2019-02-13 (×5): 2 mL via PERINEURAL

## 2019-02-13 MED ORDER — PROPOFOL 10 MG/ML IV BOLUS
INTRAVENOUS | Status: DC | PRN
  Administered 2019-02-13: 200 mg via INTRAVENOUS

## 2019-02-13 MED ORDER — OXYCODONE HCL 5 MG/5ML PO SOLN: 5.0000 mg | Freq: Once | ORAL | Status: DC | PRN

## 2019-02-13 MED ORDER — OMEGA-3-ACID ETHYL ESTERS 1 G PO CAPS: 2.0000 g | ORAL_CAPSULE | Freq: Two times a day (BID) | ORAL | Status: DC

## 2019-02-13 MED ORDER — ALOGLIPTIN BENZOATE 25 MG PO TABS: 12.5000 mg | ORAL_TABLET | Freq: Every day | ORAL | Status: DC

## 2019-02-13 MED ORDER — OXYCODONE HCL 5 MG PO TABS
10.0000 mg | ORAL_TABLET | Freq: Two times a day (BID) | ORAL | Status: DC | PRN
  Administered 2019-02-13 – 2019-02-14 (×3): 10 mg via ORAL
  Filled 2019-02-13 (×3): qty 2

## 2019-02-13 MED ORDER — METOCLOPRAMIDE HCL 5 MG PO TABS: 5.0000 mg | ORAL_TABLET | Freq: Three times a day (TID) | ORAL | Status: DC | PRN

## 2019-02-13 MED ORDER — INSULIN ASPART 100 UNIT/ML ~~LOC~~ SOLN
0.0000 [IU] | Freq: Three times a day (TID) | SUBCUTANEOUS | Status: DC
  Administered 2019-02-14 (×3): 3 [IU] via SUBCUTANEOUS
  Administered 2019-02-15: 2 [IU] via SUBCUTANEOUS
  Administered 2019-02-15: 5 [IU] via SUBCUTANEOUS
  Administered 2019-02-15: 2 [IU] via SUBCUTANEOUS
  Administered 2019-02-17: 3 [IU] via SUBCUTANEOUS
  Administered 2019-02-17: 2 [IU] via SUBCUTANEOUS
  Administered 2019-02-18: 3 [IU] via SUBCUTANEOUS

## 2019-02-13 MED ORDER — LINAGLIPTIN 5 MG PO TABS
5.0000 mg | ORAL_TABLET | Freq: Every day | ORAL | Status: DC
  Administered 2019-02-14 – 2019-02-15 (×2): 5 mg via ORAL
  Filled 2019-02-13 (×2): qty 1

## 2019-02-13 MED ORDER — ONDANSETRON HCL 4 MG/2ML IJ SOLN
INTRAMUSCULAR | Status: AC
  Filled 2019-02-13: qty 2

## 2019-02-13 MED ORDER — MIDAZOLAM HCL 5 MG/5ML IJ SOLN
INTRAMUSCULAR | Status: DC | PRN
  Administered 2019-02-13: .5 mg via INTRAVENOUS

## 2019-02-13 MED ORDER — ACETAMINOPHEN 160 MG/5ML PO SOLN: 325.0000 mg | ORAL | Status: DC | PRN

## 2019-02-13 MED ORDER — ACETAMINOPHEN 500 MG PO TABS
1000.0000 mg | ORAL_TABLET | Freq: Two times a day (BID) | ORAL | Status: DC
  Administered 2019-02-13 – 2019-02-14 (×2): 1000 mg via ORAL
  Filled 2019-02-13 (×2): qty 2

## 2019-02-13 MED ORDER — FENTANYL CITRATE (PF) 100 MCG/2ML IJ SOLN
INTRAMUSCULAR | Status: AC
  Filled 2019-02-13: qty 2

## 2019-02-13 MED ORDER — TIZANIDINE HCL 4 MG PO TABS
2.0000 mg | ORAL_TABLET | Freq: Every day | ORAL | Status: DC
  Administered 2019-02-13 – 2019-02-17 (×4): 2 mg via ORAL
  Filled 2019-02-13 (×4): qty 1

## 2019-02-13 MED ORDER — ATORVASTATIN CALCIUM 40 MG PO TABS
40.0000 mg | ORAL_TABLET | Freq: Every day | ORAL | Status: DC
  Administered 2019-02-13 – 2019-02-17 (×5): 40 mg via ORAL
  Filled 2019-02-13 (×5): qty 1

## 2019-02-13 MED ORDER — METHOCARBAMOL 1000 MG/10ML IJ SOLN
500.0000 mg | Freq: Four times a day (QID) | INTRAVENOUS | Status: DC | PRN
  Filled 2019-02-13: qty 5

## 2019-02-13 MED ORDER — TRAZODONE HCL 50 MG PO TABS
150.0000 mg | ORAL_TABLET | Freq: Every day | ORAL | Status: DC
  Administered 2019-02-13 – 2019-02-17 (×4): 150 mg via ORAL
  Filled 2019-02-13 (×4): qty 1

## 2019-02-13 MED ORDER — SODIUM CHLORIDE 0.9 % IV SOLN: INTRAVENOUS | Status: DC

## 2019-02-13 MED ORDER — BUPIVACAINE HCL (PF) 0.25 % IJ SOLN
INTRAMUSCULAR | Status: AC
  Filled 2019-02-13: qty 30

## 2019-02-13 MED ORDER — ROCURONIUM BROMIDE 10 MG/ML (PF) SYRINGE
PREFILLED_SYRINGE | INTRAVENOUS | Status: AC
  Filled 2019-02-13: qty 10

## 2019-02-13 MED ORDER — ONDANSETRON HCL 4 MG/2ML IJ SOLN
INTRAMUSCULAR | Status: DC | PRN
  Administered 2019-02-13: 4 mg via INTRAVENOUS

## 2019-02-13 MED ORDER — MIDAZOLAM HCL 2 MG/2ML IJ SOLN
INTRAMUSCULAR | Status: AC
  Filled 2019-02-13: qty 2

## 2019-02-13 MED ORDER — SUCCINYLCHOLINE CHLORIDE 200 MG/10ML IV SOSY
PREFILLED_SYRINGE | INTRAVENOUS | Status: AC
  Filled 2019-02-13: qty 10

## 2019-02-13 MED ORDER — PHENOL 1.4 % MT LIQD: 1.0000 | OROMUCOSAL | Status: DC | PRN

## 2019-02-13 MED ORDER — PROPOFOL 10 MG/ML IV BOLUS
INTRAVENOUS | Status: AC
  Filled 2019-02-13: qty 20

## 2019-02-13 MED ORDER — FENTANYL CITRATE (PF) 100 MCG/2ML IJ SOLN
25.0000 ug | INTRAMUSCULAR | Status: DC | PRN
  Administered 2019-02-13 (×2): 50 ug via INTRAVENOUS

## 2019-02-13 MED ORDER — BUPIVACAINE-EPINEPHRINE (PF) 0.5% -1:200000 IJ SOLN
INTRAMUSCULAR | Status: DC | PRN
  Administered 2019-02-13 (×5): 3 mL via PERINEURAL

## 2019-02-13 MED ORDER — LACTATED RINGERS IV SOLN: INTRAVENOUS | Status: DC

## 2019-02-13 MED ORDER — METOCLOPRAMIDE HCL 5 MG/ML IJ SOLN: 5.0000 mg | Freq: Three times a day (TID) | INTRAMUSCULAR | Status: DC | PRN

## 2019-02-13 MED ORDER — METHOCARBAMOL 500 MG PO TABS
500.0000 mg | ORAL_TABLET | Freq: Four times a day (QID) | ORAL | Status: DC | PRN
  Administered 2019-02-13 – 2019-02-14 (×2): 500 mg via ORAL
  Filled 2019-02-13 (×2): qty 1

## 2019-02-13 MED ORDER — CHLORHEXIDINE GLUCONATE 4 % EX LIQD: 60.0000 mL | Freq: Once | CUTANEOUS | Status: DC

## 2019-02-13 MED ORDER — ONDANSETRON HCL 4 MG PO TABS: 4.0000 mg | ORAL_TABLET | Freq: Four times a day (QID) | ORAL | Status: DC | PRN

## 2019-02-13 MED ORDER — PHENYLEPHRINE HCL-NACL 10-0.9 MG/250ML-% IV SOLN
INTRAVENOUS | Status: DC | PRN
  Administered 2019-02-13: 50 ug/min via INTRAVENOUS

## 2019-02-13 MED ORDER — MIDAZOLAM HCL 2 MG/2ML IJ SOLN
1.0000 mg | INTRAMUSCULAR | Status: DC
  Administered 2019-02-13: 2 mg via INTRAVENOUS
  Filled 2019-02-13: qty 2

## 2019-02-13 MED ORDER — MENTHOL 3 MG MT LOZG: 1.0000 | LOZENGE | OROMUCOSAL | Status: DC | PRN

## 2019-02-13 MED ORDER — LIDOCAINE 2% (20 MG/ML) 5 ML SYRINGE
INTRAMUSCULAR | Status: DC | PRN
  Administered 2019-02-13: 100 mg via INTRAVENOUS

## 2019-02-13 MED ORDER — OXYCODONE HCL 10 MG PO TABS: 10.0000 mg | ORAL_TABLET | Freq: Two times a day (BID) | ORAL | 0 refills | Status: DC | PRN

## 2019-02-13 MED ORDER — OXYCODONE HCL 5 MG PO TABS: 5.0000 mg | ORAL_TABLET | Freq: Once | ORAL | Status: DC | PRN

## 2019-02-13 MED ORDER — METFORMIN HCL 500 MG PO TABS
500.0000 mg | ORAL_TABLET | Freq: Two times a day (BID) | ORAL | Status: DC
  Administered 2019-02-13 – 2019-02-15 (×5): 500 mg via ORAL
  Filled 2019-02-13 (×5): qty 1

## 2019-02-13 MED ORDER — GLIPIZIDE 10 MG PO TABS
10.0000 mg | ORAL_TABLET | Freq: Two times a day (BID) | ORAL | Status: DC
  Administered 2019-02-13 – 2019-02-15 (×5): 10 mg via ORAL
  Filled 2019-02-13 (×5): qty 1

## 2019-02-13 MED ORDER — POLYETHYLENE GLYCOL 3350 17 G PO PACK: 17.0000 g | PACK | Freq: Every day | ORAL | Status: DC | PRN

## 2019-02-13 SURGICAL SUPPLY — 67 items
BAG ZIPLOCK 12X15 (MISCELLANEOUS) IMPLANT
BIT DRILL 1.6MX128 (BIT) ×2 IMPLANT
BLADE SAG 18X100X1.27 (BLADE) ×2 IMPLANT
COVER BACK TABLE 60X90IN (DRAPES) ×2 IMPLANT
COVER SURGICAL LIGHT HANDLE (MISCELLANEOUS) ×2 IMPLANT
COVER WAND RF STERILE (DRAPES) IMPLANT
CUP HUMERAL 42 PLUS 3 (Orthopedic Implant) ×2 IMPLANT
DECANTER SPIKE VIAL GLASS SM (MISCELLANEOUS) IMPLANT
DRAPE INCISE IOBAN 66X45 STRL (DRAPES) ×2 IMPLANT
DRAPE ORTHO SPLIT 77X108 STRL (DRAPES) ×2
DRAPE SHEET LG 3/4 BI-LAMINATE (DRAPES) ×2 IMPLANT
DRAPE SURG ORHT 6 SPLT 77X108 (DRAPES) ×2 IMPLANT
DRAPE U-SHAPE 47X51 STRL (DRAPES) ×2 IMPLANT
DRSG ADAPTIC 3X8 NADH LF (GAUZE/BANDAGES/DRESSINGS) ×2 IMPLANT
DRSG PAD ABDOMINAL 8X10 ST (GAUZE/BANDAGES/DRESSINGS) ×2 IMPLANT
DURAPREP 26ML APPLICATOR (WOUND CARE) ×2 IMPLANT
ELECT BLADE TIP CTD 4 INCH (ELECTRODE) ×2 IMPLANT
ELECT NEEDLE TIP 2.8 STRL (NEEDLE) ×2 IMPLANT
ELECT REM PT RETURN 15FT ADLT (MISCELLANEOUS) ×2 IMPLANT
EPIPHYSI RIGHT SZ 2 (Shoulder) ×2 IMPLANT
EPIPHYSIS RIGHT SZ 2 (Shoulder) ×1 IMPLANT
GAUZE SPONGE 4X4 12PLY STRL (GAUZE/BANDAGES/DRESSINGS) ×2 IMPLANT
GLENOSPHERE XTEND RSA 42 SD +2 (Joint) ×2 IMPLANT
GLOVE BIOGEL PI ORTHO PRO 7.5 (GLOVE) ×1
GLOVE BIOGEL PI ORTHO PRO SZ8 (GLOVE) ×1
GLOVE ORTHO TXT STRL SZ7.5 (GLOVE) ×2 IMPLANT
GLOVE PI ORTHO PRO STRL 7.5 (GLOVE) ×1 IMPLANT
GLOVE PI ORTHO PRO STRL SZ8 (GLOVE) ×1 IMPLANT
GLOVE SURG ORTHO 8.5 STRL (GLOVE) ×2 IMPLANT
GOWN STRL REUS W/TWL XL LVL3 (GOWN DISPOSABLE) ×4 IMPLANT
KIT BASIN OR (CUSTOM PROCEDURE TRAY) ×2 IMPLANT
KIT TURNOVER KIT A (KITS) IMPLANT
MANIFOLD NEPTUNE II (INSTRUMENTS) ×2 IMPLANT
METAGLENE DELTA EXTEND (Trauma) ×1 IMPLANT
METAGLENE DXTEND (Trauma) ×2 IMPLANT
NEEDLE MAYO CATGUT SZ4 (NEEDLE) ×2 IMPLANT
NS IRRIG 1000ML POUR BTL (IV SOLUTION) ×2 IMPLANT
PACK SHOULDER (CUSTOM PROCEDURE TRAY) ×2 IMPLANT
PENCIL SMOKE EVACUATOR (MISCELLANEOUS) IMPLANT
PIN GUIDE 1.2 (PIN) ×2 IMPLANT
PIN GUIDE GLENOPHERE 1.5MX300M (PIN) ×2 IMPLANT
PIN METAGLENE 2.5 (PIN) ×2 IMPLANT
PROTECTOR NERVE ULNAR (MISCELLANEOUS) ×2 IMPLANT
RESTRAINT HEAD UNIVERSAL NS (MISCELLANEOUS) ×2 IMPLANT
SCREW 4.5X24MM (Screw) ×1 IMPLANT
SCREW 4.5X36MM (Screw) ×2 IMPLANT
SCREW 48L (Screw) ×2 IMPLANT
SCREW BN 24X4.5XLCK STRL (Screw) ×1 IMPLANT
SLING ARM FOAM STRAP LRG (SOFTGOODS) ×2 IMPLANT
SMARTMIX MINI TOWER (MISCELLANEOUS)
SPONGE LAP 4X18 RFD (DISPOSABLE) IMPLANT
STEM 12 HA (Stem) ×2 IMPLANT
STRIP CLOSURE SKIN 1/2X4 (GAUZE/BANDAGES/DRESSINGS) ×2 IMPLANT
SUCTION FRAZIER HANDLE 10FR (MISCELLANEOUS) ×1
SUCTION TUBE FRAZIER 10FR DISP (MISCELLANEOUS) ×1 IMPLANT
SUT FIBERWIRE #2 38 T-5 BLUE (SUTURE) ×10
SUT MNCRL AB 4-0 PS2 18 (SUTURE) ×2 IMPLANT
SUT VIC AB 0 CT1 36 (SUTURE) ×4 IMPLANT
SUT VIC AB 0 CT2 27 (SUTURE) ×2 IMPLANT
SUT VIC AB 2-0 CT1 27 (SUTURE) ×1
SUT VIC AB 2-0 CT1 TAPERPNT 27 (SUTURE) ×1 IMPLANT
SUTURE FIBERWR #2 38 T-5 BLUE (SUTURE) ×5 IMPLANT
TAPE CLOTH SURG 6X10 WHT LF (GAUZE/BANDAGES/DRESSINGS) ×2 IMPLANT
TOWEL OR 17X26 10 PK STRL BLUE (TOWEL DISPOSABLE) ×2 IMPLANT
TOWER SMARTMIX MINI (MISCELLANEOUS) IMPLANT
WATER STERILE IRR 1000ML POUR (IV SOLUTION) ×4 IMPLANT
YANKAUER SUCT BULB TIP 10FT TU (MISCELLANEOUS) ×2 IMPLANT

## 2019-02-13 NOTE — Progress Notes (Signed)
Time out completed. AssistedDr. Hatchett with right, ultrasound guided, interscalene  block. Side rails up, monitors on throughout procedure. See vital signs in flow sheet. Tolerated Procedure well.  

## 2019-02-13 NOTE — Anesthesia Procedure Notes (Signed)
Procedure Name: Intubation Date/Time: 02/13/2019 9:40 AM Performed by: Elyn Peers, CRNA Pre-anesthesia Checklist: Patient identified, Emergency Drugs available, Suction available, Patient being monitored and Timeout performed Patient Re-evaluated:Patient Re-evaluated prior to induction Oxygen Delivery Method: Circle system utilized Preoxygenation: Pre-oxygenation with 100% oxygen Induction Type: IV induction Ventilation: Mask ventilation without difficulty Laryngoscope Size: Miller and 3 Grade View: Grade I Tube type: Oral Tube size: 7.5 mm Number of attempts: 1 Airway Equipment and Method: Stylet Placement Confirmation: ETT inserted through vocal cords under direct vision,  positive ETCO2 and breath sounds checked- equal and bilateral Secured at: 24 cm Tube secured with: Tape Dental Injury: Teeth and Oropharynx as per pre-operative assessment

## 2019-02-13 NOTE — Anesthesia Postprocedure Evaluation (Signed)
Anesthesia Post Note  Patient: Devon Williams  Procedure(s) Performed: REVERSE SHOULDER ARTHROPLASTY (Right Shoulder)     Patient location during evaluation: PACU Anesthesia Type: General Level of consciousness: sedated Pain management: pain level controlled Vital Signs Assessment: post-procedure vital signs reviewed and stable Respiratory status: spontaneous breathing Cardiovascular status: stable Postop Assessment: no apparent nausea or vomiting Anesthetic complications: no    Last Vitals:  Vitals:   02/13/19 1215 02/13/19 1230  BP: (!) 118/99 (!) 130/95  Pulse: 72 70  Resp: 14 13  Temp:    SpO2: 100% 100%    Last Pain:  Vitals:   02/13/19 1155  TempSrc:   PainSc: 0-No pain   Pain Goal: Patients Stated Pain Goal: 3 (02/13/19 0804)                 Caren Macadam

## 2019-02-13 NOTE — Interval H&P Note (Signed)
History and Physical Interval Note:  02/13/2019 9:25 AM  Devon Williams  has presented today for surgery, with the diagnosis of Right shoulder osteoarthritis.  The various methods of treatment have been discussed with the patient and family. After consideration of risks, benefits and other options for treatment, the patient has consented to  Procedure(s) with comments: REVERSE SHOULDER ARTHROPLASTY (Right) - interscalene block as a surgical intervention.  The patient's history has been reviewed, patient examined, no change in status, stable for surgery.  I have reviewed the patient's chart and labs.  Questions were answered to the patient's satisfaction.     Verlee Rossetti

## 2019-02-13 NOTE — Brief Op Note (Signed)
02/13/2019  11:57 AM  PATIENT:  Fatima Sanger  74 y.o. male  PRE-OPERATIVE DIAGNOSIS:  Right shoulder osteoarthritis, rotator cuff tear  POST-OPERATIVE DIAGNOSIS:  Right shoulder osteoarthritis, rotator cuff tear  PROCEDURE:  Procedure(s) with comments: REVERSE SHOULDER ARTHROPLASTY (Right) - interscalene block DePuy Delta Xtend  SURGEON:  Surgeon(s) and Role:    Beverely Low, MD - Primary  PHYSICIAN ASSISTANT:   ASSISTANTS: Thea Gist, PA-C   ANESTHESIA:   regional and general  EBL:  200 mL   BLOOD ADMINISTERED:none  DRAINS: none   LOCAL MEDICATIONS USED:  MARCAINE     SPECIMEN:  No Specimen  DISPOSITION OF SPECIMEN:  N/A  COUNTS:  YES  TOURNIQUET:  * No tourniquets in log *  DICTATION: .Other Dictation: Dictation Number [id number]  PLAN OF CARE: Admit to inpatient   PATIENT DISPOSITION:  PACU - hemodynamically stable.   Delay start of Pharmacological VTE agent (>24hrs) due to surgical blood loss or risk of bleeding: not applicable

## 2019-02-13 NOTE — Anesthesia Procedure Notes (Addendum)
Anesthesia Regional Block: Interscalene brachial plexus block   Pre-Anesthetic Checklist: ,, timeout performed, Correct Patient, Correct Site, Correct Laterality, Correct Procedure, Correct Position, site marked, Risks and benefits discussed,  Surgical consent,  Pre-op evaluation,  At surgeon's request and post-op pain management  Laterality: Right and Upper  Prep: chloraprep       Needles:  Injection technique: Single-shot  Needle Type: Echogenic Stimulator Needle     Needle Length: 4cm  Needle Gauge: 21   Needle insertion depth: 1 cm   Additional Needles:   Procedures:,,,, ultrasound used (permanent image in chart),,,,  Narrative:  Start time: 02/13/2019 8:30 AM End time: 02/13/2019 8:40 AM Injection made incrementally with aspirations every 5 mL.  Performed by: Personally  Anesthesiologist: Leilani Able, MD

## 2019-02-13 NOTE — Discharge Instructions (Signed)
Ice to the shoulder constantly.  Keep the incision covered and clean and dry for one week, then ok to get it wet in the shower.  Do exercise as instructed several times per day.  DO NOT reach behind your back or push up out of a chair with the operative arm.  Use a sling while you are up and around for comfort, may remove while seated. Lean to the operative side to let the arm pendulum away from your body to remove the sling. Please hug a pillow under the arm to rest.  Keep pillow propped behind the operative elbow.  Follow up with Dr Ranell Patrick in two weeks in the office, call [phone] for appt

## 2019-02-13 NOTE — Transfer of Care (Signed)
Immediate Anesthesia Transfer of Care Note  Patient: Devon Williams  Procedure(s) Performed: REVERSE SHOULDER ARTHROPLASTY (Right Shoulder)  Patient Location: PACU  Anesthesia Type:General and GA combined with regional for post-op pain  Level of Consciousness: awake, alert  and patient cooperative  Airway & Oxygen Therapy: Patient Spontanous Breathing and Patient connected to face mask oxygen  Post-op Assessment: Report given to RN and Post -op Vital signs reviewed and stable  Post vital signs: Reviewed and stable  Last Vitals:  Vitals Value Taken Time  BP 120/74 02/13/19 1155  Temp    Pulse 76 02/13/19 1156  Resp 17 02/13/19 1156  SpO2 100 % 02/13/19 1156  Vitals shown include unvalidated device data.  Last Pain:  Vitals:   02/13/19 0856  TempSrc:   PainSc: 0-No pain      Patients Stated Pain Goal: 3 (02/13/19 0804)  Complications: No apparent anesthesia complications

## 2019-02-14 ENCOUNTER — Inpatient Hospital Stay (HOSPITAL_COMMUNITY): Payer: No Typology Code available for payment source

## 2019-02-14 LAB — GLUCOSE, CAPILLARY
Glucose-Capillary: 170 mg/dL — ABNORMAL HIGH (ref 70–99)
Glucose-Capillary: 162 mg/dL — ABNORMAL HIGH (ref 70–99)
Glucose-Capillary: 147 mg/dL — ABNORMAL HIGH (ref 70–99)
Glucose-Capillary: 110 mg/dL — ABNORMAL HIGH (ref 70–99)

## 2019-02-14 LAB — BASIC METABOLIC PANEL
Anion gap: 9 (ref 5–15)
BUN: 13 mg/dL (ref 8–23)
CO2: 23 mmol/L (ref 22–32)
Calcium: 8.9 mg/dL (ref 8.9–10.3)
Chloride: 103 mmol/L (ref 98–111)
Creatinine, Ser: 1.1 mg/dL (ref 0.61–1.24)
GFR calc Af Amer: >60 mL/min (ref >60)
GFR calc non Af Amer: >60 mL/min (ref >60)
Glucose, Bld: 138 mg/dL — ABNORMAL HIGH (ref 70–99)
Potassium: 4.3 mmol/L (ref 3.5–5.1)
Sodium: 135 mmol/L (ref 135–145)

## 2019-02-14 LAB — HEMOGLOBIN AND HEMATOCRIT, BLOOD
HCT: 37.8 % — ABNORMAL LOW (ref 39.0–52.0)
Hemoglobin: 12.4 g/dL — ABNORMAL LOW (ref 13.0–17.0)

## 2019-02-14 LAB — URINALYSIS, ROUTINE W REFLEX MICROSCOPIC
Bilirubin Urine: NEGATIVE
Glucose, UA: NEGATIVE mg/dL
Hgb urine dipstick: NEGATIVE
Ketones, ur: NEGATIVE mg/dL
Leukocytes,Ua: NEGATIVE
Nitrite: NEGATIVE
Protein, ur: NEGATIVE mg/dL
Specific Gravity, Urine: 1.005 (ref 1.005–1.030)
pH: 6 (ref 5.0–8.0)

## 2019-02-14 MED ORDER — CEFAZOLIN SODIUM-DEXTROSE 2-4 GM/100ML-% IV SOLN
2.0000 g | Freq: Three times a day (TID) | INTRAVENOUS | Status: AC
  Administered 2019-02-14 – 2019-02-15 (×3): 2 g via INTRAVENOUS
  Filled 2019-02-14 (×3): qty 100

## 2019-02-14 MED ORDER — ACETAMINOPHEN 500 MG PO TABS
1000.0000 mg | ORAL_TABLET | Freq: Four times a day (QID) | ORAL | Status: DC
  Administered 2019-02-14 – 2019-02-18 (×15): 1000 mg via ORAL
  Filled 2019-02-14 (×15): qty 2

## 2019-02-14 NOTE — Progress Notes (Signed)
MEWS score now green.  

## 2019-02-14 NOTE — Op Note (Signed)
NAMEABDO, DENAULT MEDICAL RECORD HB:[id number] ACCOUNT [id number] DATE OF BIRTH:Jul 15, 1945 FACILITY: [facility] LOCATION: [facility] PHYSICIAN:STEVEN Russ Halo, MD  OPERATIVE REPORT  DATE OF PROCEDURE:  02/13/2019  PREOPERATIVE DIAGNOSIS:  Right shoulder rotator cuff tear and glenohumeral arthritis.  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  PROCEDURE PERFORMED:  Right reverse total shoulder arthroplasty with subscap repair.  ATTENDING SURGEON:  Malon Kindle, MD   ASSISTANT:  Modesto Charon, [place], who was scrubbed during the entire procedure and necessary for satisfactory completion of surgery.  ANESTHESIA:  General anesthesia was used plus interscalene block.  ESTIMATED BLOOD LOSS:  200 mL.  FLUID REPLACEMENT:  1500 mL crystalloid.  INSTRUMENT COUNTS:  Correct.  COMPLICATIONS:  No complications.  ANTIBIOTICS:  Perioperative antibiotics were given.  INDICATIONS:  The patient is a 74 year old male with worsening right shoulder pain and dysfunction secondary to full thickness rotator cuff tear with retraction as well as early arthritis in the shoulder joint.  Given the patient's poor function and  worsening pain we talked about options and recommended reverse shoulder replacement for ease of recovery and reliability of functional recovery and pain relief.  Risks and benefits of surgical management discussed in detail with the patient.  Informed  consent obtained.  DESCRIPTION OF PROCEDURE:  After an adequate level of anesthesia was achieved, the patient was positioned in the modified beach chair position.  Right shoulder correctly identified and sterilely prepped and draped in the usual manner.  Time-out called,  verifying correct patient, correct site.  We entered the shoulder using a standard deltopectoral incision starting at the coracoid process extending down to the anterior humerus.  Dissection down through subcutaneous tissues using  Bovie.  We identified  cephalic vein, took that laterally with the deltoid pectoralis taken medially.  Conjoined tendon identified and retracted medially.  Biceps was tenodesed in situ with 0 Vicryl figure-of-eight suture x2 incorporating the pectoralis tendon.  We then  released the subscapularis subperiosteally off the lesser tuberosity in a peel technique.  We placed #2 FiberWire in a modified Mason-Allen suture technique at the free end of the tendon x3 for repair of the subscap.  We then released the inferior  capsule progressively externally rotating.  We then released the biceps and a little bit of the remaining supraspinatus and infraspinatus was already gone.  We extended the shoulder and delivered the humeral head out of the wound.  There was end-stage  arthritis, bone-on-bone.  At this point, we entered the proximal humerus with a 6 mm reamer.  We reamed up to a size 12.  We then placed our 12 intramedullary guide and resected the head at 10 degrees of retroversion with an oscillating saw.  We removed  the head at the back table and used that for bone graft.  We then removed excess osteophytes around the humeral neck with a rongeur.  Once we had gotten down to native bone we subluxed the humerus posteriorly and that provided 360 degree exposure of the  glenoid.  We removed the capsule and the labrum and the biceps stump.  Next, we identified the center point for our guide pin.  We placed that.  We then reamed for a metaglene baseplate, and drilled our central peg hole.  We impacted the metaglene into  position.  We then secured with a 48 screw inferiorly, 36 screw superiorly, both locked, and a 24 anteriorly, locked.  We had good baseplate fixation to bone.  We then took our 42+2  standard glenosphere and attached that to the metaglene baseplate.  Once  that was seated fully and screwed into position then we directed our attention back towards the humeral side where we completed our metaphyseal  preparation with the 2 right metaphyseal reamer.  Once we had that reamed we took our 12 stem 2 right  metaphysis set on the 0 setting and impacted into 10 degrees of retroversion.  We had good coverage for our implant and good security.  We then selected the 42+3 poly tray and placed that on the humeral side.  We reduced the shoulder and had nice  stability throughout a full arc of motion with no impingement.  We removed all trial components, irrigated thoroughly, drilled drill holes in the lesser tuberosity and placed 2 FiberWire suture for repair of the subscap.  We then used impaction grafting  technique with available bone graft and impacted the HA coated press-fit stem, the 12 stem with the 2 right set on the 0 setting and placed in 10 degrees of retroversion.  With that and impacted into position and secured, we selected a 42+3 real poly,  impacted that on the humeral tray.  We reduced the shoulder with a nice little pop.  Everything tight and conjoined appropriately tensioned, but not too tight and the axillary nerve and not under excessive tension.  We irrigated thoroughly.  We then went  ahead and repaired the subscapularis anatomically back to the lesser tuberosity with sutures through bone.  We had a nice repair of that.  We then went ahead and irrigated again and then repaired the deltopectoral interval with 0 Vicryl suture followed  by 2-0 Vicryl for subcutaneous closure and 4-0 Monocryl for skin.  Steri-Strips applied followed by sterile dressing.  The patient tolerated surgery well.  CN/NUANCE  D:02/13/2019 T:02/14/2019 JOB:[id number]

## 2019-02-14 NOTE — Evaluation (Signed)
Occupational Therapy Evaluation Patient Details Name: Devon Williams MRN: [id number] DOB: April 02, 1945 Today's Date: 02/14/2019    History of Present Illness Devon Williams is a 74 y/o male with PMH including Arthritis, Atrial fibrillation, CKD, Diabetes mellitus without complication, Dysrhythmia (2018). 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    Clinical Impression   PTA Pt was independent in ADL and mobility, although he does have a lift chair that he uses on bad days. Today Pt presents with cognitive deficits in awareness, problem solving, and safety awareness. Per his wife Venida Jarvis, he had a similar incident after his TKA (but it was worse) his cognition impacted his ability to recall and receive education about his shoulder rehab. Pt and OT reviewed conservative shoulder precautions (handout provided): Educated patient on don doff sling (Pt unable to return demonstration),washing under arms with cloth, never to wash directly on incision site, avoid shoulder movement. positioning with pillows in chair for sleeping and comfort (recommend recliner if possible at night), pt educated on dressing care and sequencing during bathing/dressing.  Home exercise program as stated below (indicated by MD). Pt was mod A to come to the EOB. While sitting EOB he had a strong left posterior lean requiring mod support. He required mod A +2 for sit<>stand and small steps up the bed with buckling in BLE. Currently recommending SNF placement post-acute to maximize safety and independence in ADL and functional transfers. Pt might be able to progress home pending cognition. Spoke with wife who is in agreement for POC. OT will continue to follow acutely.     Follow Up Recommendations  SNF;Supervision/Assistance - 24 hour(previous experience at [facility])    Equipment Recommendations  Other (comment)(defer to next venue)    Recommendations for Other Services       Precautions / Restrictions  Precautions Precautions: Shoulder Type of Shoulder Precautions: conservative Shoulder Interventions: Shoulder sling/immobilizer;At all times;Off for dressing/bathing/exercises Precaution Booklet Issued: Yes (comment) Precaution Comments: shoulder dc handout and exercise sheets reviewed in full Required Braces or Orthoses: Sling Restrictions Weight Bearing Restrictions: Yes RUE Weight Bearing: Non weight bearing Other Position/Activity Restrictions: ok for gentle lap slides      Mobility Bed Mobility Overal bed mobility: Needs Assistance Bed Mobility: Supine to Sit;Sit to Supine     Supine to sit: Mod assist;HOB elevated(assist to elevate trunk and pull up with LUE) Sit to supine: Mod assist;+2 for physical assistance;+2 for safety/equipment;Max assist(max A for BLE back into bed, support of trunk/shoulder down)   General bed mobility comments: trouble sequencing and initiating movement  Transfers Overall transfer level: Needs assistance Equipment used: 2 person hand held assist Transfers: Sit to/from Stand Sit to Stand: Mod assist;+2 physical assistance;+2 safety/equipment;From elevated surface         General transfer comment: bed elevated, use of gait belt essential, knees with slight buckling, mod A +2 for boost into standing, multimodal cueing throughout    Balance Overall balance assessment: Needs assistance Sitting-balance support: Single extremity supported;Feet supported Sitting balance-Leahy Scale: Poor Sitting balance - Comments: strong lean, requiring at least min A - mostly mod A to maintain balance EOB (with no awareness of lean) Postural control: Left lateral lean;Posterior lean Standing balance support: Single extremity supported Standing balance-Leahy Scale: Poor Standing balance comment: dependent on external support from therapist                           ADL either performed or assessed with clinical  judgement   ADL Overall ADL's : Needs  assistance/impaired Eating/Feeding: Moderate assistance;Bed level(HOB elevated)   Grooming: Moderate assistance;Sitting   Upper Body Bathing: Maximal assistance;Sitting   Lower Body Bathing: Maximal assistance   Upper Body Dressing : Maximal assistance   Lower Body Dressing: Maximal assistance;+2 for physical assistance;+2 for safety/equipment;Sit to/from stand   Toilet Transfer: Moderate assistance;+2 for physical assistance;+2 for safety/equipment;Stand-pivot Toilet Transfer Details (indicate cue type and reason): Pt performed sit<>stand from bed with mod A +2 and knees buckling slightly - did not pivot this session Toileting- Clothing Manipulation and Hygiene: Total assistance       Functional mobility during ADLs: Moderate assistance;+2 for physical assistance;+2 for safety/equipment General ADL Comments: see shoulder section for more information below     Vision Baseline Vision/History: Wears glasses Wears Glasses: At all times Patient Visual Report: No change from baseline       Perception     Praxis      Pertinent Vitals/Pain Pain Assessment: 0-10 Pain Score: 7  Pain Location: R shoulder Pain Descriptors / Indicators: Aching;Dull Pain Intervention(s): Limited activity within patient's tolerance;Monitored during session;Repositioned;Ice applied     Hand Dominance Right   Extremity/Trunk Assessment Upper Extremity Assessment Upper Extremity Assessment: RUE deficits/detail RUE Deficits / Details: anticipated deficits in strength/ROM post-op RUE Sensation: decreased light touch(block still partially in place) RUE Coordination: decreased fine motor;decreased gross motor   Lower Extremity Assessment Lower Extremity Assessment: Generalized weakness;Defer to PT evaluation   Cervical / Trunk Assessment Cervical / Trunk Assessment: Normal   Communication Communication Communication: HOH   Cognition Arousal/Alertness: Lethargic(keeps eyes closed but will respond  to questions) Behavior During Therapy: Flat affect Overall Cognitive Status: Impaired/Different from baseline Area of Impairment: Following commands;Safety/judgement;Awareness;Problem solving                       Following Commands: Follows one step commands with increased time Safety/Judgement: Decreased awareness of safety;Decreased awareness of deficits Awareness: Emergent Problem Solving: Slow processing;Decreased initiation;Difficulty sequencing;Requires verbal cues;Requires tactile cues General Comments: Pt with history of post-anesthesia "fog" can answer all orientation questions appropriately but requires multimodal cues for anything else   General Comments  on RA throughout session, Pt with SpO2 90-95% throughout    Exercises Exercises: Shoulder Shoulder Exercises Elbow Flexion: AAROM;Right;10 reps;Seated Elbow Extension: AAROM;Right;10 reps;Seated Wrist Flexion: AROM;Right Wrist Extension: AROM;Right Digit Composite Flexion: AROM;Right Composite Extension: AROM;Right Neck Flexion: AROM Neck Extension: AROM Neck Lateral Flexion - Right: AROM Neck Lateral Flexion - Left: AROM   Shoulder Instructions Shoulder Instructions Donning/doffing shirt without moving shoulder: Maximal assistance Method for sponge bathing under operated UE: Maximal assistance Donning/doffing sling/immobilizer: Maximal assistance Correct positioning of sling/immobilizer: Maximal assistance ROM for elbow, wrist and digits of operated UE: Maximal assistance Sling wearing schedule (on at all times/off for ADL's): Maximal assistance Proper positioning of operated UE when showering: Maximal assistance Positioning of UE while sleeping: Maximal assistance    Home Living Family/patient expects to be discharged to:: Private residence Living Arrangements: Spouse/significant other;Children Available Help at Discharge: Family;Available 24 hours/day Type of Home: House Home Access: Stairs to  enter [employer] of Steps: 2 Entrance Stairs-Rails: Right Home Layout: Able to live on main level with bedroom/bathroom     Bathroom Shower/Tub: Chief Strategy Officer: Standard(comfort height)     Home Equipment: Bedside commode;Walker - 2 wheels;Cane - single point   Additional Comments: also has lift chair in living room      Prior Functioning/Environment Level  of Independence: Independent                 OT Problem List: Decreased strength;Decreased range of motion;Decreased knowledge of use of DME or AE;Decreased knowledge of precautions;Pain;Impaired UE functional use      OT Treatment/Interventions:      OT Goals(Current goals can be found in the care plan section) Acute Rehab OT Goals Patient Stated Goal: be able to sleep through the night again OT Goal Formulation: With patient Time For Goal Achievement: 02/28/19 Potential to Achieve Goals: Good ADL Goals Pt Will Perform Upper Body Bathing: with min guard assist;standing Pt Will Perform Upper Body Dressing: with min guard assist;with caregiver independent in assisting;sitting Pt Will Perform Lower Body Dressing: with supervision;sit to/from stand Pt Will Transfer to Toilet: with supervision;ambulating Pt Will Perform Toileting - Clothing Manipulation and hygiene: with supervision;sit to/from stand Pt/caregiver will Perform Home Exercise Program: Right Upper extremity;Independently;With written HEP provided  OT Frequency:     Barriers to D/C:            Co-evaluation PT/OT/SLP Co-Evaluation/Treatment: Yes Reason for Co-Treatment: Necessary to address cognition/behavior during functional activity;For patient/therapist safety;To address functional/ADL transfers PT goals addressed during session: Mobility/safety with mobility;Balance OT goals addressed during session: ADL's and self-care;Strengthening/ROM      AM-PAC OT "6 Clicks" Daily Activity     Outcome Measure Help from  another person eating meals?: A Lot Help from another person taking care of personal grooming?: A Lot Help from another person toileting, which includes using toliet, bedpan, or urinal?: A Lot Help from another person bathing (including washing, rinsing, drying)?: A Lot Help from another person to put on and taking off regular upper body clothing?: A Lot Help from another person to put on and taking off regular lower body clothing?: A Lot 6 Click Score: 12   End of Session Equipment Utilized During Treatment: Gait belt;Other (comment)(sling) Nurse Communication: Mobility status;Need for lift equipment(stedy)  Activity Tolerance: Patient limited by lethargy;Other (comment)(limited by cognition) Patient left: in bed;with call bell/phone within reach;with bed alarm set;with family/visitor present  OT Visit Diagnosis: Muscle weakness (generalized) (M62.81);Pain Pain - Right/Left: Right Pain - part of body: Shoulder                Time: 6789-3810 OT Time Calculation (min): 42 min Charges:  OT General Charges $OT Visit: 1 Visit OT Evaluation $OT Eval Moderate Complexity: 1 Mod OT Treatments $Self Care/Home Management : 8-22 mins  Nyoka Cowden OTR/L Acute Rehabilitation Services Pager: [phone] Office: [phone]  Evern Bio Lonia Roane 02/14/2019, 12:00 PM

## 2019-02-14 NOTE — Progress Notes (Signed)
    Subjective:  Patient reports pain as mild to moderate.  Denies N/V/CP/SOB.   Objective:   VITALS:   Vitals:   02/13/19 1743 02/13/19 2123 02/14/19 0020 02/14/19 0413  BP: 135/87 (!) 149/76 123/62 134/65  Pulse: 76 81 74 72  Resp:  18 15 16   Temp: 98.6 F (37 C) (!) 101.9 F (38.8 C) (!) 100.5 F (38.1 C) 100.1 F (37.8 C)  TempSrc: Oral Oral Oral Oral  SpO2: 98% 94% 94% 94%  Weight:      Height:        NAD ABD soft RUE: sling intact. Aquacel dressing c/d/i. NVI.  Lab Results  Component Value Date   WBC 8.9 02/12/2019   HGB 12.4 (L) 02/14/2019   HCT 37.8 (L) 02/14/2019   MCV 94.9 02/12/2019   PLT 283 02/12/2019   BMET    Component Value Date/Time   NA 135 02/14/2019 0440   K 4.3 02/14/2019 0440   CL 103 02/14/2019 0440   CO2 23 02/14/2019 0440   GLUCOSE 138 (H) 02/14/2019 0440   BUN 13 02/14/2019 0440   CREATININE 1.10 02/14/2019 0440   CALCIUM 8.9 02/14/2019 0440   GFRNONAA >60 02/14/2019 0440   GFRAA >60 02/14/2019 0440     Assessment/Plan: 1 Day Post-Op   Active Problems:   S/P shoulder replacement, right   Sling, NWB RUE DVT ppx: apixaban, SCDs, TEDS PO pain control OT Fever: Tmax 101.9 last night, Dr. 02/16/2019 wants to continue ancef today and workup with CXR and U/A if he spikes another fever Dispo: monitor temp, d/c tomorrow if afebrile for 24 hrs   Devon Williams 02/14/2019, 8:11 AM   02/16/2019, MD [phone] [facility] is now [facility]  [place] [address], [place], [place] Phone: [phone] [email or web address] Facebook  [phone]

## 2019-02-14 NOTE — Evaluation (Signed)
Physical Therapy Evaluation Patient Details Name: Devon Williams MRN: [id number] DOB: 1945/06/17 Today's Date: 02/14/2019   History of Present Illness  Devon Williams is a 74 y/o male with PMH including Arthritis, Atrial fibrillation, CKD, Diabetes mellitus without complication, Dysrhythmia (2018). 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  Clinical Impression  Pt admitted with above diagnosis.  Pt requiring 2 person assist for all basic mobility (bed mobility and transfers) today; independent at baseline. Wife reports issues with post anesthesia "fog" in past. May need SNF depending on progress. Will continue to follow and assess d/c needs  Pt currently with functional limitations due to the deficits listed below (see PT Problem List). Pt will benefit from skilled PT to increase their independence and safety with mobility to allow discharge to the venue listed below.       Follow Up Recommendations SNF(pending progress)    Equipment Recommendations  Other (comment)(TBD)    Recommendations for Other Services       Precautions / Restrictions Precautions Precautions: Shoulder Type of Shoulder Precautions: conservative Shoulder Interventions: Shoulder sling/immobilizer;At all times;Off for dressing/bathing/exercises Precaution Booklet Issued: Yes (comment) Precaution Comments: shoulder dc handout and exercise sheets reviewed in full Required Braces or Orthoses: Sling Restrictions Weight Bearing Restrictions: Yes RUE Weight Bearing: Non weight bearing Other Position/Activity Restrictions: ok for gentle lap slides      Mobility  Bed Mobility Overal bed mobility: Needs Assistance Bed Mobility: Supine to Sit;Sit to Supine     Supine to sit: Mod assist;HOB elevated(with OT prior to PT arrival) Sit to supine: Mod assist;+2 for physical assistance;+2 for safety/equipment;Max assist   General bed mobility comments: trouble sequencing and initiating  movement  Transfers Overall transfer level: Needs assistance Equipment used: 1 person hand held assist Transfers: Sit to/from Stand Sit to Stand: Mod assist;+2 physical assistance;+2 safety/equipment;From elevated surface         General transfer comment: bed elevated, use of gait belt essential, knees with slight buckling, mod A +2 for boost into standing, multimodal cuing throughout  Ambulation/Gait             General Gait Details: unable at this time  Stairs            Wheelchair Mobility    Modified Rankin (Stroke Patients Only)       Balance Overall balance assessment: Needs assistance Sitting-balance support: Single extremity supported;Feet supported Sitting balance-Leahy Scale: Poor Sitting balance - Comments: strong lean, requiring at least min A - mostly mod A to maintain balance EOB (with no awareness of lean) Postural control: Left lateral lean;Posterior lean Standing balance support: Single extremity supported Standing balance-Leahy Scale: Poor Standing balance comment: dependent on external support from therapist and UE support                             Pertinent Vitals/Pain Pain Assessment: 0-10 Pain Score: 7  Pain Location: R shoulder Pain Descriptors / Indicators: Aching;Dull Pain Intervention(s): Limited activity within patient's tolerance;Monitored during session;Ice applied    Home Living Family/patient expects to be discharged to:: Private residence Living Arrangements: Spouse/significant other;Children Available Help at Discharge: Family;Available 24 hours/day Type of Home: House Home Access: Stairs to enter Entrance Stairs-Rails: Right Entrance Stairs-Number of Steps: 2 Home Layout: Able to live on main level with bedroom/bathroom Home Equipment: Bedside commode;Walker - 2 wheels;Cane - single point Additional Comments: also has lift chair in living room    Prior Function Level of  Independence: Independent                Hand Dominance   Dominant Hand: Right    Extremity/Trunk Assessment   Upper Extremity Assessment Upper Extremity Assessment: Defer to OT evaluation RUE Deficits / Details: anticipated deficits in strength/ROM post-op RUE Sensation: decreased light touch(block still partially in place) RUE Coordination: decreased fine motor;decreased gross motor    Lower Extremity Assessment Lower Extremity Assessment: Generalized weakness    Cervical / Trunk Assessment Cervical / Trunk Assessment: Normal  Communication   Communication: HOH  Cognition Arousal/Alertness: Lethargic Behavior During Therapy: Flat affect Overall Cognitive Status: Impaired/Different from baseline Area of Impairment: Following commands;Safety/judgement;Awareness;Problem solving                       Following Commands: Follows one step commands with increased time Safety/Judgement: Decreased awareness of safety;Decreased awareness of deficits Awareness: Emergent Problem Solving: Slow processing;Decreased initiation;Difficulty sequencing;Requires verbal cues;Requires tactile cues General Comments: Pt with history of post-anesthesia "fog" can answer all orientation questions appropriately but requires multimodal cues for anything else      General Comments General comments (skin integrity, edema, etc.): on RA throughout session, Pt with SpO2 90-95% throughout    Exercises Shoulder Exercises Elbow Flexion: AAROM;Right;10 reps;Seated Elbow Extension: AAROM;Right;10 reps;Seated Wrist Flexion: AROM;Right Wrist Extension: AROM;Right Digit Composite Flexion: AROM;Right Composite Extension: AROM;Right Neck Flexion: AROM Neck Extension: AROM Neck Lateral Flexion - Right: AROM Neck Lateral Flexion - Left: AROM Donning/doffing shirt without moving shoulder: Maximal assistance Method for sponge bathing under operated UE: Maximal assistance Donning/doffing sling/immobilizer: Maximal  assistance Correct positioning of sling/immobilizer: Maximal assistance ROM for elbow, wrist and digits of operated UE: Maximal assistance Sling wearing schedule (on at all times/off for ADL's): Maximal assistance Proper positioning of operated UE when showering: Maximal assistance Positioning of UE while sleeping: Maximal assistance   Assessment/Plan    PT Assessment Patient needs continued PT services  PT Problem List Decreased strength;Decreased activity tolerance;Decreased balance;Decreased knowledge of use of DME;Decreased cognition;Decreased mobility;Decreased safety awareness;Decreased knowledge of precautions       PT Treatment Interventions DME instruction;Therapeutic exercise;Gait training;Functional mobility training;Therapeutic activities;Patient/family education    PT Goals (Current goals can be found in the Care Plan section)  Acute Rehab PT Goals Patient Stated Goal: be able to sleep through the night again PT Goal Formulation: With patient Time For Goal Achievement: 02/27/19 Potential to Achieve Goals: Good    Frequency Min 3X/week   Barriers to discharge        Co-evaluation PT/OT/SLP Co-Evaluation/Treatment: Yes Reason for Co-Treatment: Necessary to address cognition/behavior during functional activity;To address functional/ADL transfers;For patient/therapist safety PT goals addressed during session: Mobility/safety with mobility OT goals addressed during session: ADL's and self-care;Strengthening/ROM       AM-PAC PT "6 Clicks" Mobility  Outcome Measure Help needed turning from your back to your side while in a flat bed without using bedrails?: A Lot Help needed moving from lying on your back to sitting on the side of a flat bed without using bedrails?: A Lot Help needed moving to and from a bed to a chair (including a wheelchair)?: Total Help needed standing up from a chair using your arms (e.g., wheelchair or bedside chair)?: Total Help needed to walk in  hospital room?: Total Help needed climbing 3-5 steps with a railing? : Total 6 Click Score: 8    End of Session Equipment Utilized During Treatment: Gait belt;Other (comment)(R shoulder sling) Activity Tolerance: Patient limited by fatigue;Patient  limited by lethargy Patient left: in bed;with call bell/phone within reach;with bed alarm set;with family/visitor present   PT Visit Diagnosis: Other abnormalities of gait and mobility (R26.89)    Time: 3976-7341 PT Time Calculation (min) (ACUTE ONLY): 21 min   Charges:   PT Evaluation $PT Eval Moderate Complexity: 1 Mod          Dorann Davidson, PT   Acute Rehab Dept [facility]): [phone]   02/14/2019   [facility] 02/14/2019, 12:09 PM

## 2019-02-14 NOTE — Progress Notes (Addendum)
Notified by Wyline Copas, RN that patient was febrile at 101.5. Temperature had previously spiked to 100.5 overnight. Currently receiving 1000 mg Tylenol BID. Cannot take NSAIDs due to anticoagulation medication (Eliquis).   Patient asymptomatic at this time. Incentive spirometer encouraged. Orders placed for CXR and UA. Will continue to monitor.   ADDENDUM: CXR showed atelectasis, UA within normal limits. Will increase tylenol to 1000 mg Q6.   Arther Abbott, PA-C [facility] [place]

## 2019-02-14 NOTE — Progress Notes (Signed)
   02/13/19 2123  Vitals  Temp (!) 101.9 F (38.8 C)  Temp Source Oral  BP (!) 149/76  MAP (mmHg) 97  BP Location Left Arm  BP Method Automatic  Patient Position (if appropriate) Lying  Pulse Rate 81  Pulse Rate Source Monitor  Resp 18  Oxygen Therapy  SpO2 94 %  O2 Device Room Air  MEWS Score  MEWS Temp 2  MEWS Systolic 0  MEWS Pulse 0  MEWS RR 0  MEWS LOC 0  MEWS Score 2  MEWS Score Color Yellow  MEWS Assessment  Is this an acute change? Yes (Will give Tylenol)  MEWS guidelines implemented *See Row Information* Yellow  Provider Notification  Provider Name/Title Ralene Bathe, PA  Date Provider Notified 02/13/19  Time Provider Notified 2249  Notification Type Call  Notification Reason Change in status  Response No new orders  Date of Provider Response 02/13/19  Time of Provider Response 2259   Called [facility] to report Yellow MEWS score. Yellow MEWS score guidelines have been implemented. Reinforced education on use of incentive spirometer and gave Tylenol to help with temperature control. No new orders were given at this time. Ralene Bathe, on call PA from [facility] is aware. Will continue to monitor patient.

## 2019-02-15 LAB — GLUCOSE, CAPILLARY
Glucose-Capillary: 139 mg/dL — ABNORMAL HIGH (ref 70–99)
Glucose-Capillary: 210 mg/dL — ABNORMAL HIGH (ref 70–99)
Glucose-Capillary: 147 mg/dL — ABNORMAL HIGH (ref 70–99)
Glucose-Capillary: 79 mg/dL (ref 70–99)

## 2019-02-15 NOTE — Plan of Care (Signed)

## 2019-02-15 NOTE — Progress Notes (Signed)
Physical Therapy Treatment Patient Details Name: Devon Williams MRN: [id number] DOB: 1945/07/08 Today's Date: 02/15/2019    History of Present Illness 74 y/o male with PMH including Arthritis, Atrial fibrillation, CKD, Diabetes mellitus without complication, Dysrhythmia (2018). 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    PT Comments    Pt progressing, much improved compared to yesterday however pt does not recall having therapy at all previous day.  Pt remains extremely unsteady and continues to require assist of 2 for mobility.  Pending pt's progress, hopefully will d/c to home with HHPT, however, unsure if he will be ready for home by tomorrow. Will continue to follow   Follow Up Recommendations  Home health PT;Supervision/Assistance - 24 hour(if continues to progress)     Equipment Recommendations  Other (comment)(TBD)    Recommendations for Other Services       Precautions / Restrictions Precautions Precautions: Shoulder Type of Shoulder Precautions: Conservative protocal Shoulder Interventions: Shoulder sling/immobilizer;At all times;Off for dressing/bathing/exercises Precaution Booklet Issued: Yes (comment) Precaution Comments: shoulder dc handout and exercise sheets reviewed in full Required Braces or Orthoses: Sling Restrictions Weight Bearing Restrictions: Yes RUE Weight Bearing: Non weight bearing Other Position/Activity Restrictions: ok for gentle lap slides    Mobility  Bed Mobility Overal bed mobility: Needs Assistance Bed Mobility: Supine to Sit     Supine to sit: Mod assist;HOB elevated;+2 for physical assistance;+2 for safety/equipment     General bed mobility comments: Mod A +2 to transition hips towards EOB and elevate trunk  Transfers Overall transfer level: Needs assistance Equipment used: 1 person hand held assist Transfers: Sit to/from Stand Sit to Stand: +2 physical assistance;+2 safety/equipment;From elevated surface;Min assist          General transfer comment: Min A to power up into standing and gain balance. +2 for safety  Ambulation/Gait Ambulation/Gait assistance: Min assist;+2 physical assistance;+2 safety/equipment Gait Distance (Feet): 80 Feet Assistive device: 1 person hand held assist Gait Pattern/deviations: Wide base of support;Shuffle;Decreased step length - right;Decreased step length - left;Drifts right/left     General Gait Details: pt with extremely unsteady gait.  HHA and second person for balance and safety   Stairs             Wheelchair Mobility    Modified Rankin (Stroke Patients Only)       Balance Overall balance assessment: Needs assistance Sitting-balance support: Single extremity supported;Feet supported Sitting balance-Leahy Scale: Fair   Postural control: Left lateral lean Standing balance support: Single extremity supported;During functional activity Standing balance-Leahy Scale: Poor Standing balance comment: dependent on external support from therapist and UE support                            Cognition Arousal/Alertness: Awake/alert Behavior During Therapy: Flat affect Overall Cognitive Status: Impaired/Different from baseline Area of Impairment: Following commands;Safety/judgement;Awareness;Problem solving;Memory                     Memory: Decreased short-term memory Following Commands: Follows one step commands with increased time Safety/Judgement: Decreased awareness of safety;Decreased awareness of deficits Awareness: Emergent Problem Solving: Slow processing;Decreased initiation;Difficulty sequencing;Requires verbal cues;Requires tactile cues General Comments: Pt not recalling receiving therapy yesterday or any education. Pt requiring increased time and cues throughout.       Exercises Shoulder Exercises Elbow Flexion: Right;10 reps;AROM;Supine Elbow Extension: Right;10 reps;AROM;Supine Wrist Flexion: AROM;Right;10  reps;Supine Wrist Extension: AROM;Right;10 reps;Supine Digit Composite Flexion: AROM;Right;Supine;10 reps Composite Extension:  AROM;Right;10 reps;Supine Neck Flexion: AROM Neck Extension: AROM Neck Lateral Flexion - Right: AROM Neck Lateral Flexion - Left: AROM Donning/doffing shirt without moving shoulder: Maximal assistance Method for sponge bathing under operated UE: Maximal assistance Donning/doffing sling/immobilizer: Maximal assistance Correct positioning of sling/immobilizer: Maximal assistance ROM for elbow, wrist and digits of operated UE: Maximal assistance Sling wearing schedule (on at all times/off for ADL's): Maximal assistance Proper positioning of operated UE when showering: Maximal assistance Positioning of UE while sleeping: Maximal assistance    General Comments General comments (skin integrity, edema, etc.): wife present       Pertinent Vitals/Pain Pain Assessment: Faces Faces Pain Scale: Hurts even more Pain Location: R shoulder and back Pain Descriptors / Indicators: Aching;Dull Pain Intervention(s): Limited activity within patient's tolerance;Monitored during session;Repositioned    Home Living                      Prior Function            PT Goals (current goals can now be found in the care plan section) Acute Rehab PT Goals Patient Stated Goal: be able to sleep through the night again PT Goal Formulation: With patient Time For Goal Achievement: 02/27/19 Potential to Achieve Goals: Good Progress towards PT goals: Progressing toward goals    Frequency    Min 3X/week      PT Plan Current plan remains appropriate    Co-evaluation PT/OT/SLP Co-Evaluation/Treatment: Yes Reason for Co-Treatment: For patient/therapist safety;To address functional/ADL transfers PT goals addressed during session: Mobility/safety with mobility OT goals addressed during session: ADL's and self-care;Strengthening/ROM;Other (comment)(exercises)       AM-PAC PT "6 Clicks" Mobility   Outcome Measure  Help needed turning from your back to your side while in a flat bed without using bedrails?: A Lot Help needed moving from lying on your back to sitting on the side of a flat bed without using bedrails?: A Lot Help needed moving to and from a bed to a chair (including a wheelchair)?: A Lot Help needed standing up from a chair using your arms (e.g., wheelchair or bedside chair)?: A Lot Help needed to walk in hospital room?: A Lot Help needed climbing 3-5 steps with a railing? : A Lot 6 Click Score: 12    End of Session Equipment Utilized During Treatment: Gait belt;Other (comment)(R shoulder sling) Activity Tolerance: Patient tolerated treatment well Patient left: in chair;with call bell/phone within reach;with chair alarm set;with family/visitor present   PT Visit Diagnosis: Other abnormalities of gait and mobility (R26.89)     Time: 9798-9211 PT Time Calculation (min) (ACUTE ONLY): 23 min  Charges:  $Gait Training: 8-22 mins                     Baxter Flattery, PT   Acute Rehab Dept [facility]): [phone]   02/15/2019    [facility] 02/15/2019, 3:31 PM

## 2019-02-15 NOTE — Progress Notes (Signed)
   Subjective: 2 Days Post-Op Procedure(s) (LRB): REVERSE SHOULDER ARTHROPLASTY (Right) Patient reports pain as mild.   Patient seen in rounds for Dr. Ranell Patrick. Patient is doing well. He continued to be febrile overnight, with highest temperature of 102.1. CXR was indicative of atelectasis, and UA was within normal limits. Ancef completed. Tylenol was increased to 1000 mg Q6. Temperature this morning of 99.6. Otherwise, patient is doing well. He reports minimal discomfort in the right shoulder. Foley catheter in place. Denies CP, SHOB, cough, abdominal pain, N/V.  Plan is to go Home after hospital stay.  Objective: Vital signs in last 24 hours: Temp:  [99.2 F (37.3 C)-102.1 F (38.9 C)] 99.2 F (37.3 C) (01/24 0503) Pulse Rate:  [72-85] 72 (01/24 0503) Resp:  [17-18] 18 (01/24 0503) BP: (126-165)/(74-90) 151/90 (01/24 0503) SpO2:  [92 %-94 %] 94 % (01/24 0503)  Intake/Output from previous day:  Intake/Output Summary (Last 24 hours) at 02/15/2019 0847 Last data filed at 02/15/2019 0600 Gross per 24 hour  Intake 2296.84 ml  Output 6200 ml  Net -3903.16 ml    Intake/Output this shift: No intake/output data recorded.  Labs: Recent Labs    02/12/19 1147 02/14/19 0440  HGB 14.9 12.4*   Recent Labs    02/12/19 1147 02/14/19 0440  WBC 8.9  --   RBC 4.71  --   HCT 44.7 37.8*  PLT 283  --    Recent Labs    02/12/19 1147 02/14/19 0440  NA 138 135  K 4.6 4.3  CL 102 103  CO2 28 23  BUN 13 13  CREATININE 1.13 1.10  GLUCOSE 191* 138*  CALCIUM 9.8 8.9   No results for input(s): LABPT, INR in the last 72 hours.  Exam: General - Patient is Alert and Oriented Extremity - Neurologically intact Sensation intact distally Intact pulses distally ROM of the wrist and digits intact. Dressing/Incision - clean, dry, no drainage Motor Function - intact, moving wrist and fingers well on exam.   Past Medical History:  Diagnosis Date  . Arthritis   . Atrial fibrillation (HCC)    . Chronic kidney disease   . Diabetes mellitus without complication (HCC)   . Dysrhythmia 2018   Pt has SVT then a card. loop recorder implanted at the [place]  . GERD (gastroesophageal reflux disease)     Assessment/Plan: 2 Days Post-Op Procedure(s) (LRB): REVERSE SHOULDER ARTHROPLASTY (Right) Active Problems:   S/P shoulder replacement, right  Estimated body mass index is 34.87 kg/m as calculated from the following:   Height as of this encounter: 5\' 11"  (1.803 m).   Weight as of this encounter: 113.4 kg. Advance diet Up with therapy  DVT Prophylaxis - TED hose and Eliquis, SCDs NWB to the RUE Remain in sling  Continue monitoring temperature today. Continue pain management. Continue working with therapy. Plan for likely discharge home tomorrow. Strongly encouraged use of incentive spirometer.   , PA-C Orthopedic Surgery [phone] 02/15/2019, 8:47 AM

## 2019-02-15 NOTE — Progress Notes (Addendum)
Occupational Therapy Treatment Patient Details Name: Devon Williams MRN: [id number] DOB: 02-Jun-1945 Today's Date: 02/15/2019    History of present illness 74 y/o male with PMH including Arthritis, Atrial fibrillation, CKD, Diabetes mellitus without complication, Dysrhythmia (2018). 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   OT comments  Pt progressing towards established OT goals. Pt continues to present with decreased ST memory (not recalling yesterday's therapy session or recalling exercises from beginning of session) and problem solving. Reviewed RUE exercises and pt performed while supine in bed. Requiring Max A for sling management; reviewed compensatory techniques for bathing and dressing with pt and wife. Pt performing functional mobility with Mod A +2; may benefit from cane. Pending pt's progress, recommend dc to home with [facility], however, unsure if he will be ready for home by tomorrow. Will continue to follow acutely as admitted.     Follow Up Recommendations  Home health OT;SNF;Supervision/Assistance - 24 hour(Pt may be able to progress to home with [facility]; however unsure if he can progress to a safe level to dc home by tomorrow (1/25))    Equipment Recommendations  None recommended by OT    Recommendations for Other Services      Precautions / Restrictions Precautions Precautions: Shoulder Type of Shoulder Precautions: Conservative protocal Shoulder Interventions: Shoulder sling/immobilizer;At all times;Off for dressing/bathing/exercises Precaution Booklet Issued: Yes (comment) Precaution Comments: shoulder dc handout and exercise sheets reviewed in full Required Braces or Orthoses: Sling Restrictions Weight Bearing Restrictions: Yes RUE Weight Bearing: Non weight bearing Other Position/Activity Restrictions: ok for gentle lap slides       Mobility Bed Mobility Overal bed mobility: Needs Assistance Bed Mobility: Supine to Sit     Supine to sit: Mod assist;HOB  elevated;+2 for physical assistance;+2 for safety/equipment     General bed mobility comments: Mod A +2 to transition hips towards EOB and elevate trunk  Transfers Overall transfer level: Needs assistance Equipment used: 1 person hand held assist Transfers: Sit to/from Stand Sit to Stand: +2 physical assistance;+2 safety/equipment;From elevated surface;Min assist         General transfer comment: Min A to power up into standing and gain balance. +2 for safety    Balance Overall balance assessment: Needs assistance Sitting-balance support: Single extremity supported;Feet supported Sitting balance-Leahy Scale: Fair   Postural control: Left lateral lean Standing balance support: Single extremity supported;During functional activity Standing balance-Leahy Scale: Poor Standing balance comment: dependent on external support from therapist and UE support                           ADL either performed or assessed with clinical judgement   ADL Overall ADL's : Needs assistance/impaired           Upper Body Bathing Details (indicate cue type and reason): Reviewed compensatory techniques for UB bathing; wife verbalized understanding     Upper Body Dressing : Maximal assistance;Sitting Upper Body Dressing Details (indicate cue type and reason): Reviewed compensatory techniques for donning/doffing button up shorts. Wife verablized understanding. Reviewed sling management. Max A for sling management     Toilet Transfer: Moderate assistance;+2 for physical assistance;+2 for safety/equipment;Stand-pivot Toilet Transfer Details (indicate cue type and reason): Pt performed sit<>stand from bed with mod A +2 and knees buckling slightly - did not pivot this session         Functional mobility during ADLs: Moderate assistance;+2 for physical assistance;+2 for safety/equipment(single hand held A) General ADL Comments: Pt presenting with  increased activity tolerance. Does not  recall any education from yesterday session. Requiring Mod A for balance and safety during mobility.      Vision       Perception     Praxis      Cognition Arousal/Alertness: Awake/alert Behavior During Therapy: Flat affect Overall Cognitive Status: Impaired/Different from baseline Area of Impairment: Following commands;Safety/judgement;Awareness;Problem solving;Memory                     Memory: Decreased short-term memory Following Commands: Follows one step commands with increased time Safety/Judgement: Decreased awareness of safety;Decreased awareness of deficits Awareness: Emergent Problem Solving: Slow processing;Decreased initiation;Difficulty sequencing;Requires verbal cues;Requires tactile cues General Comments: Pt not recalling receiving therapy yesterday or any education. Pt requiring increased time and cues throughout.         Exercises Exercises: Shoulder Shoulder Exercises Elbow Flexion: Right;10 reps;AROM;Supine Elbow Extension: Right;10 reps;AROM;Supine Wrist Flexion: AROM;Right;10 reps;Supine Wrist Extension: AROM;Right;10 reps;Supine Digit Composite Flexion: AROM;Right;Supine;10 reps Composite Extension: AROM;Right;10 reps;Supine Neck Flexion: AROM Neck Extension: AROM Neck Lateral Flexion - Right: AROM Neck Lateral Flexion - Left: AROM   Shoulder Instructions Shoulder Instructions Donning/doffing shirt without moving shoulder: Maximal assistance Method for sponge bathing under operated UE: Maximal assistance Donning/doffing sling/immobilizer: Maximal assistance Correct positioning of sling/immobilizer: Maximal assistance ROM for elbow, wrist and digits of operated UE: Maximal assistance Sling wearing schedule (on at all times/off for ADL's): Maximal assistance Proper positioning of operated UE when showering: Maximal assistance Positioning of UE while sleeping: Maximal assistance     General Comments Wife present throughout session     Pertinent Vitals/ Pain       Pain Assessment: Faces Faces Pain Scale: Hurts even more Pain Location: R shoulder Pain Descriptors / Indicators: Aching;Dull Pain Intervention(s): Monitored during session;Limited activity within patient's tolerance;Repositioned  Home Living                                          Prior Functioning/Environment              Frequency           Progress Toward Goals  OT Goals(current goals can now be found in the care plan section)  Progress towards OT goals: Progressing toward goals  Acute Rehab OT Goals Patient Stated Goal: be able to sleep through the night again OT Goal Formulation: With patient Time For Goal Achievement: 02/28/19 Potential to Achieve Goals: Good ADL Goals Pt Will Perform Upper Body Bathing: with min guard assist;standing Pt Will Perform Upper Body Dressing: with min guard assist;with caregiver independent in assisting;sitting Pt Will Perform Lower Body Dressing: with supervision;sit to/from stand Pt Will Transfer to Toilet: with supervision;ambulating Pt Will Perform Toileting - Clothing Manipulation and hygiene: with supervision;sit to/from stand Pt/caregiver will Perform Home Exercise Program: Right Upper extremity;Independently;With written HEP provided  Plan Discharge plan needs to be updated    Co-evaluation    PT/OT/SLP Co-Evaluation/Treatment: Yes Reason for Co-Treatment: For patient/therapist safety;To address functional/ADL transfers   OT goals addressed during session: ADL's and self-care;Strengthening/ROM;Other (comment)(exercises)      AM-PAC OT "6 Clicks" Daily Activity     Outcome Measure   Help from another person eating meals?: A Little Help from another person taking care of personal grooming?: A Little Help from another person toileting, which includes using toliet, bedpan, or urinal?: A Lot Help from another person bathing (including  washing, rinsing, drying)?: A  Lot Help from another person to put on and taking off regular upper body clothing?: A Lot Help from another person to put on and taking off regular lower body clothing?: A Lot 6 Click Score: 14    End of Session Equipment Utilized During Treatment: Gait belt;Other (comment)(sling)  OT Visit Diagnosis: Muscle weakness (generalized) (M62.81);Pain Pain - Right/Left: Right Pain - part of body: Shoulder   Activity Tolerance Patient tolerated treatment well;Other (comment)(limited by cognition)   Patient Left in chair;with call bell/phone within reach;with chair alarm set;with family/visitor present   Nurse Communication Mobility status        Time: 3559-7416 OT Time Calculation (min): 27 min  Charges: OT General Charges $OT Visit: 1 Visit OT Treatments $Self Care/Home Management : 8-22 mins  Gauge Winski MSOT, OTR/L Acute Rehab Pager: [phone] Office: [phone]  Theodoro Grist Gwenevere [place] 02/15/2019, 2:31 PM

## 2019-02-16 ENCOUNTER — Inpatient Hospital Stay (HOSPITAL_COMMUNITY): Payer: No Typology Code available for payment source

## 2019-02-16 ENCOUNTER — Encounter: Payer: Self-pay | Admitting: *Deleted

## 2019-02-16 LAB — CBC WITH DIFFERENTIAL/PLATELET
Abs Immature Granulocytes: 0.09 10*3/uL — ABNORMAL HIGH (ref 0.00–0.07)
Basophils Absolute: 0 10*3/uL (ref 0.0–0.1)
Basophils Relative: 0 %
Eosinophils Absolute: 0.1 10*3/uL (ref 0.0–0.5)
Eosinophils Relative: 0 %
HCT: 36.8 % — ABNORMAL LOW (ref 39.0–52.0)
Hemoglobin: 12.2 g/dL — ABNORMAL LOW (ref 13.0–17.0)
Immature Granulocytes: 1 %
Lymphocytes Relative: 6 %
Lymphs Abs: 0.8 10*3/uL (ref 0.7–4.0)
MCH: 31.5 pg (ref 26.0–34.0)
MCHC: 33.2 g/dL (ref 30.0–36.0)
MCV: 95.1 fL (ref 80.0–100.0)
Monocytes Absolute: 1.1 10*3/uL — ABNORMAL HIGH (ref 0.1–1.0)
Monocytes Relative: 8 %
Neutro Abs: 12 10*3/uL — ABNORMAL HIGH (ref 1.7–7.7)
Neutrophils Relative %: 85 %
Platelets: 210 10*3/uL (ref 150–400)
RBC: 3.87 MIL/uL — ABNORMAL LOW (ref 4.22–5.81)
RDW: 13.1 % (ref 11.5–15.5)
WBC: 14.1 10*3/uL — ABNORMAL HIGH (ref 4.0–10.5)
nRBC: 0 % (ref 0.0–0.2)

## 2019-02-16 LAB — BASIC METABOLIC PANEL
Anion gap: 9 (ref 5–15)
BUN: 17 mg/dL (ref 8–23)
CO2: 24 mmol/L (ref 22–32)
Calcium: 9 mg/dL (ref 8.9–10.3)
Chloride: 99 mmol/L (ref 98–111)
Creatinine, Ser: 1.33 mg/dL — ABNORMAL HIGH (ref 0.61–1.24)
GFR calc Af Amer: >60 mL/min (ref >60)
GFR calc non Af Amer: 53 mL/min — ABNORMAL LOW (ref >60)
Glucose, Bld: 136 mg/dL — ABNORMAL HIGH (ref 70–99)
Potassium: 4.2 mmol/L (ref 3.5–5.1)
Sodium: 132 mmol/L — ABNORMAL LOW (ref 135–145)

## 2019-02-16 LAB — GLUCOSE, CAPILLARY
Glucose-Capillary: 69 mg/dL — ABNORMAL LOW (ref 70–99)
Glucose-Capillary: 86 mg/dL (ref 70–99)
Glucose-Capillary: 117 mg/dL — ABNORMAL HIGH (ref 70–99)
Glucose-Capillary: 126 mg/dL — ABNORMAL HIGH (ref 70–99)
Glucose-Capillary: 116 mg/dL — ABNORMAL HIGH (ref 70–99)

## 2019-02-16 NOTE — Plan of Care (Signed)
resolved 

## 2019-02-16 NOTE — Progress Notes (Signed)
Physical Therapy Treatment Patient Details Name: Devon Williams MRN: [id number] DOB: 1945-02-23 Today's Date: 02/16/2019    History of Present Illness 74 y/o male with PMH including Arthritis, Atrial fibrillation, CKD, Diabetes mellitus without complication, Dysrhythmia (2018). 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    PT Comments    Pt slightly confused initially, easily re-oriented. Pt has had no narcotics per RN, only tylenol. Pt requiring max assist of 2 today for bed mobility and transfers. Have updated  Recommendations again to SNF. Pt is unsafe to d/c home, he is at risk for falls and his wife would be unable to manage him at his current status.   Follow Up Recommendations  SNF;Supervision/Assistance - 24 hour     Equipment Recommendations  None recommended by PT    Recommendations for Other Services       Precautions / Restrictions Precautions Precautions: Shoulder;Fall Shoulder Interventions: Shoulder sling/immobilizer;At all times;Off for dressing/bathing/exercises Required Braces or Orthoses: Sling Restrictions RUE Weight Bearing: Non weight bearing    Mobility  Bed Mobility Overal bed mobility: Needs Assistance Bed Mobility: Supine to Sit     Supine to sit: HOB elevated;+2 for physical assistance;+2 for safety/equipment;Max assist     General bed mobility comments: Max A +2 to transition hips towards EOB and elevate trunk. pt scooted down toward foot of bed on PT arrival   Transfers Overall transfer level: Needs assistance Equipment used: 1 person hand held assist Transfers: Sit to/from Stand Sit to Stand: +2 physical assistance;+2 safety/equipment;From elevated surface;Max assist         General transfer comment: max assist of 2 to achieve knee, hip, trunk extension extension. incr time and multi-modal cues   Ambulation/Gait Ambulation/Gait assistance: Max assist;+2 physical assistance;+2 safety/equipment Gait Distance (Feet): 4  Feet Assistive device: Straight cane Gait Pattern/deviations: Wide base of support;Shuffle;Decreased step length - right;Decreased step length - left     General Gait Details: pt extremely unsteady, attempted steps forward however had to pull chair to pt for fall prevention.  +2 for balance and safety   Stairs             Wheelchair Mobility    Modified Rankin (Stroke Patients Only)       Balance Overall balance assessment: Needs assistance Sitting-balance support: Single extremity supported;Feet supported Sitting balance-Leahy Scale: Poor Sitting balance - Comments: strong lean, requiring at least min A - mostly mod A to maintain balance EOB (with no awareness of lean) Postural control: Left lateral lean Standing balance support: Single extremity supported;During functional activity Standing balance-Leahy Scale: Poor Standing balance comment: dependent on external support from therapist and UE support                            Cognition Arousal/Alertness: Awake/alert Behavior During Therapy: Flat affect;Restless Overall Cognitive Status: Impaired/Different from baseline Area of Impairment: Following commands;Safety/judgement;Awareness;Problem solving;Memory                     Memory: Decreased short-term memory Following Commands: Follows one step commands with increased time;Follows multi-step commands inconsistently Safety/Judgement: Decreased awareness of safety;Decreased awareness of deficits   Problem Solving: Slow processing;Decreased initiation;Difficulty sequencing;Requires verbal cues;Requires tactile cues General Comments: pt does not recall walking yesterday. He states he is "at [facility]" adn had surgery on his " Left shoulder".  nonsensical speech at onset of session      Exercises      General Comments  Pertinent Vitals/Pain Pain Assessment: Faces Faces Pain Scale: Hurts a little bit Pain Location: R shoulder  Pain  Descriptors / Indicators: Aching;Dull Pain Intervention(s): Limited activity within patient's tolerance;Monitored during session;Repositioned;Premedicated before session(had tylenol)    Home Living                      Prior Function            PT Goals (current goals can now be found in the care plan section) Acute Rehab PT Goals Patient Stated Goal: be able to sleep through the night again PT Goal Formulation: With patient Time For Goal Achievement: 02/27/19 Potential to Achieve Goals: Good Progress towards PT goals: Progressing toward goals(slowly)    Frequency    Min 3X/week      PT Plan Current plan remains appropriate;Discharge plan needs to be updated    Co-evaluation              AM-PAC PT "6 Clicks" Mobility   Outcome Measure  Help needed turning from your back to your side while in a flat bed without using bedrails?: Total Help needed moving from lying on your back to sitting on the side of a flat bed without using bedrails?: Total Help needed moving to and from a bed to a chair (including a wheelchair)?: Total Help needed standing up from a chair using your arms (e.g., wheelchair or bedside chair)?: A Lot Help needed to walk in hospital room?: Total Help needed climbing 3-5 steps with a railing? : Total 6 Click Score: 7    End of Session Equipment Utilized During Treatment: Gait belt;Other (comment)(R sling) Activity Tolerance: Patient tolerated treatment well Patient left: in chair;with call bell/phone within reach;with chair alarm set Nurse Communication: Mobility status PT Visit Diagnosis: Other abnormalities of gait and mobility (R26.89)     Time: 1013-1030 PT Time Calculation (min) (ACUTE ONLY): 17 min  Charges:  $Therapeutic Activity: 8-22 mins                     Delice Bison, PT   Acute Rehab Dept [facility]): [phone]   02/16/2019    [facility] 02/16/2019, 12:38 PM

## 2019-02-16 NOTE — Discharge Summary (Signed)
Orthopedic Discharge Summary        Physician Discharge Summary  Patient ID: Devon Williams MRN: [id number] DOB/AGE: [birth year] 74 y.o.  Admit date: 02/13/2019 Discharge date: 02/16/2019   Procedures:  Procedure(s) (LRB): REVERSE SHOULDER ARTHROPLASTY (Right)  Attending Physician:  Dr. Esmond Plants  Admission Diagnoses:   Right shoulder cuff arthropathy  Discharge Diagnoses:  Right shoulder cuff arthropathy   Past Medical History:  Diagnosis Date  . Arthritis   . Atrial fibrillation ([place])   . Chronic kidney disease   . Diabetes mellitus without complication ([place])   . Dysrhythmia 2018   Pt has SVT then a card. loop recorder implanted at the [place]  . GERD (gastroesophageal reflux disease)     PCP: Derrill Center., MD   Discharged Condition: fair  Hospital Course:  Patient underwent the above stated procedure on 02/13/2019. Patient tolerated the procedure well and brought to the recovery room in good condition and subsequently to the floor. Patient had an episode of fever after surgery with x-rays showing atelectasis.  Patient was monitored until fever resolved. Overall pt feeling better at discharge and was stable.   Disposition: Discharge disposition: 01-Home or Self Care      with follow up in 2 weeks   Follow-up Information    Netta Cedars, MD. Call in 2 weeks.   Specialty: Orthopedic Surgery Why: [phone] Contact information: [address] [place] [address] [phone]           Discharge Instructions    Call MD / Call 911   Complete by: As directed    If you experience chest pain or shortness of breath, CALL 911 and be transported to the hospital emergency room.  If you develope a fever above 101 F, pus (white drainage) or increased drainage or redness at the wound, or calf pain, call your surgeon's office.   Constipation Prevention   Complete by: As directed    Drink plenty of fluids.  Prune juice may be helpful.  You may use  a stool softener, such as Colace (over the counter) 100 mg twice a day.  Use MiraLax (over the counter) for constipation as needed.   Diet - low sodium heart healthy   Complete by: As directed    Increase activity slowly as tolerated   Complete by: As directed       Allergies as of 02/16/2019      Reactions   Nabumetone Other (See Comments)   UNSPECIFIED REACTION       Medication List    TAKE these medications   acetaminophen 500 MG tablet Commonly known as: TYLENOL Take 1,000 mg by mouth 2 (two) times daily.   Alogliptin Benzoate 25 MG Tabs Take 12.5 mg by mouth daily.   atorvastatin 40 MG tablet Commonly known as: LIPITOR Take 40 mg by mouth at bedtime.   Eliquis 5 MG Tabs tablet Generic drug: apixaban Take 5 mg by mouth 2 (two) times daily.   gabapentin 300 MG capsule Commonly known as: NEURONTIN Take 300-600 mg by mouth See admin instructions. Take 1 capsule (300 mg) by mouth in the morning, 1 capsule (300 mg) by mouth at lunch, & take 2 capsules (600 mg) by mouth at night.   glipiZIDE 10 MG tablet Commonly known as: GLUCOTROL Take 10 mg by mouth 2 (two) times daily.   loratadine 10 MG tablet Commonly known as: CLARITIN Take 10 mg by mouth daily.   metFORMIN 500 MG tablet Commonly known as:  GLUCOPHAGE Take 500 mg by mouth 2 (two) times daily.   omega-3 acid ethyl esters 1 g capsule Commonly known as: LOVAZA Take 2 g by mouth 2 (two) times daily.   omeprazole 20 MG capsule Commonly known as: PRILOSEC Take 20 mg by mouth at bedtime.   Oxycodone HCl 10 MG Tabs Take 1 tablet (10 mg total) by mouth 2 (two) times daily as needed (pain.).   SOOTHE XP XTRA PROTECTION OP Place 1 drop into both eyes 3 (three) times daily as needed (dry/irritated eyes.).   tamsulosin 0.4 MG Caps capsule Commonly known as: FLOMAX Take 0.4 mg by mouth at bedtime.   tiZANidine 4 MG tablet Commonly known as: ZANAFLEX Take 2 mg by mouth at bedtime.   traZODone 150 MG tablet  Commonly known as: DESYREL Take 150 mg by mouth at bedtime.   trospium 20 MG tablet Commonly known as: SANCTURA Take 20 mg by mouth at bedtime.   venlafaxine XR 150 MG 24 hr capsule Commonly known as: EFFEXOR-XR Take 150 mg by mouth 2 (two) times daily. Morning and noon   Vitamin D3 125 MCG (5000 UT) Tabs Take 5,000 Units by mouth daily.         Signed: Thea Gist 02/16/2019, 7:22 AM  [facility] is now [employer] [address], [address], [place], [place] [address] Phone: [phone] Facebook  Instagram  [employer]

## 2019-02-16 NOTE — Progress Notes (Signed)
Physical Therapy Treatment Patient Details Name: Devon Williams MRN: [id number] DOB: 10/10/1945 Today's Date: 02/16/2019    History of Present Illness 74 y/o male with PMH including Arthritis, Atrial fibrillation, CKD, Diabetes mellitus without complication, Dysrhythmia (2018). 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    PT Comments    Pt more confused this pm, states he is "in [place]". incr difficulty following commands and with pronounced left lateral lean in sitting (pt with significant L lateral lean on Saturday as well, balance improved Sunday, worse again today).  Pt requiring incr assist with transfers this pm, +2 max to total assist. Continue to follow in acute setting.  Continue to recommend SNF   Follow Up Recommendations  SNF;Supervision/Assistance - 24 hour     Equipment Recommendations  None recommended by PT    Recommendations for Other Services       Precautions / Restrictions Precautions Precautions: Shoulder;Fall Shoulder Interventions: Shoulder sling/immobilizer;At all times;Off for dressing/bathing/exercises Precaution Comments: sling adjusted, pt with RUE almost out of sling Required Braces or Orthoses: Sling Restrictions RUE Weight Bearing: Non weight bearing    Mobility  Bed Mobility Overal bed mobility: Needs Assistance Bed Mobility: Sit to Supine     Supine to sit: HOB elevated;+2 for physical assistance;+2 for safety/equipment;Max assist Sit to supine: Max assist;Total assist;+2 for physical assistance;+2 for safety/equipment   General bed mobility comments: max to total assist of 2 to lower trunk and bring LEs onto bed.  pt able to scoot laterally in supine with multi-modal cues and incr time  Transfers Overall transfer level: Needs assistance Equipment used: 1 person hand held assist Transfers: Sit to/from [facility] Sit to Stand: Max assist;+2 physical assistance;+2 safety/equipment Stand pivot transfers: Max  assist;+2 physical assistance;+2 safety/equipment       General transfer comment: max assist of 2 to achieve knee, hip, trunk extension extension. incr time and multi-modal cues   Ambulation/Gait Ambulation/Gait assistance: Max assist;+2 physical assistance;+2 safety/equipment Gait Distance (Feet): 4 Feet Assistive device: Straight cane Gait Pattern/deviations: Wide base of support;Shuffle;Decreased step length - right;Decreased step length - left     General Gait Details: pt extremely unsteady, attempted steps forward however had to pull chair to pt for fall prevention.  +2 for balance and safety   Stairs             Wheelchair Mobility    Modified Rankin (Stroke Patients Only)       Balance Overall balance assessment: Needs assistance Sitting-balance support: Single extremity supported;Feet supported Sitting balance-Leahy Scale: Poor Sitting balance - Comments: strong lean, requiring mostly mod A to maintain balance EOB (with no awareness of lean) Postural control: Left lateral lean Standing balance support: Single extremity supported;During functional activity Standing balance-Leahy Scale: Poor Standing balance comment: dependent on external support from therapist and UE support                            Cognition Arousal/Alertness: Awake/alert Behavior During Therapy: Restless Overall Cognitive Status: Impaired/Different from baseline Area of Impairment: Following commands;Safety/judgement;Awareness;Problem solving;Memory                     Memory: Decreased short-term memory Following Commands: Follows multi-step commands inconsistently;Follows one step commands inconsistently Safety/Judgement: Decreased awareness of safety;Decreased awareness of deficits   Problem Solving: Slow processing;Decreased initiation;Difficulty sequencing;Requires verbal cues;Requires tactile cues General Comments: pt states he is "in [place], [place]" more confused  this pm with  difficulty following commands       Exercises Shoulder Exercises Elbow Flexion: Right;10 reps;AROM;Supine Elbow Extension: Right;10 reps;AROM;Supine Wrist Flexion: AROM;Right;10 reps;Supine Wrist Extension: AROM;Right;10 reps;Supine Digit Composite Flexion: AROM;Right;Supine;10 reps Composite Extension: AROM;Right;10 reps;Supine Neck Flexion: AROM Neck Extension: AROM Neck Lateral Flexion - Right: AROM Neck Lateral Flexion - Left: AROM    General Comments        Pertinent Vitals/Pain Pain Assessment: Faces Pain Score: 4  Faces Pain Scale: Hurts a little bit Pain Location: R shoulder  Pain Descriptors / Indicators: Grimacing Pain Intervention(s): Limited activity within patient's tolerance;Monitored during session;Repositioned    Home Living                      Prior Function            PT Goals (current goals can now be found in the care plan section) Acute Rehab PT Goals Patient Stated Goal: be able to sleep through the night again PT Goal Formulation: With patient Time For Goal Achievement: 02/27/19 Potential to Achieve Goals: Good Progress towards PT goals: Progressing toward goals    Frequency    Min 3X/week      PT Plan Current plan remains appropriate;Discharge plan needs to be updated    Co-evaluation              AM-PAC PT "6 Clicks" Mobility   Outcome Measure  Help needed turning from your back to your side while in a flat bed without using bedrails?: Total Help needed moving from lying on your back to sitting on the side of a flat bed without using bedrails?: Total Help needed moving to and from a bed to a chair (including a wheelchair)?: Total Help needed standing up from a chair using your arms (e.g., wheelchair or bedside chair)?: A Lot Help needed to walk in hospital room?: Total Help needed climbing 3-5 steps with a railing? : Total 6 Click Score: 7    End of Session Equipment Utilized During Treatment: Gait  belt Activity Tolerance: Other (comment);Patient limited by fatigue(limited by confusion adn weakness) Patient left: in bed;with call bell/phone within reach;with bed alarm set Nurse Communication: Mobility status PT Visit Diagnosis: Other abnormalities of gait and mobility (R26.89)     Time: 1610-9604 PT Time Calculation (min) (ACUTE ONLY): 18 min  Charges:  $Therapeutic Activity: 8-22 mins                     Delice Bison, PT   Acute Rehab Dept [facility]): [phone]   02/16/2019    [facility] 02/16/2019, 3:17 PM

## 2019-02-16 NOTE — Progress Notes (Signed)
Hypoglycemic Event  CBG: 69  Treatment: Juice   Symptoms: none  Follow-up CBG: Time0730 CBG Result:86  Possible Reasons for Event:   Comments/MD notified:B Dixon PA on floor made aware    Devon Williams, Devon Williams

## 2019-02-16 NOTE — Progress Notes (Signed)
Patient unable to progress with PT/OT Alphonsa Overall PA notified D Susann Givens RN

## 2019-02-16 NOTE — Progress Notes (Addendum)
   Subjective: 3 Days Post-Op Procedure(s) (LRB): REVERSE SHOULDER ARTHROPLASTY (Right)  Pt feeling better  Fever has resolved Still coughing occasionally Mild pain in the right shoulder Patient reports pain as mild.  Objective:   VITALS:   Vitals:   02/15/19 2057 02/16/19 0555  BP: (!) 144/78 127/66  Pulse: 84 78  Resp: 16 17  Temp: 99.2 F (37.3 C) 98.4 F (36.9 C)  SpO2: 95% 94%    Right shoulder dressing intact nv intact distally No rashes or edema distally Sling in place  LABS Recent Labs    02/14/19 0440  HGB 12.4*  HCT 37.8*    Recent Labs    02/14/19 0440  NA 135  K 4.3  BUN 13  CREATININE 1.10  GLUCOSE 138*     Assessment/Plan: 3 Days Post-Op Procedure(s) (LRB): REVERSE SHOULDER ARTHROPLASTY (Right) D/c home today F/u in 2 weeks in the office Continue PT/OT   Alphonsa Overall PA-C, MPAS [facility] is now [facility]  [place] [address], [address], [place], [place] [address] Phone: [phone] [email or web address]  [employer], broadcasting/film/video      Patient not doing well from a functional standpoint with increasing lethargy and some hypoxia.  Will recheck labs and CXR to ensure that patient not developing pneumonia. Unsafe for discharge to home.   Dr Beverely Low

## 2019-02-16 NOTE — Progress Notes (Signed)
   02/16/19 2258  MEWS Assessment  Is this an acute change? Yes  MEWS guidelines implemented *See Row Information* Yellow  Provider Notification  Provider Name/Title Sudie Bailey  Date Provider Notified 02/16/19  Time Provider Notified 2250  Notification Type Page  Notification Reason Change in status  Response No new orders  Date of Provider Response 02/16/19  Time of Provider Response 2255   Pt spiked a temp again of 102.9, rest of VS wnl. Pt resting comfortably in bed with no complaints. Notified answering service for [facility]. Received a call back from [facility]. No new orders, continue to monitor and encourage IS.

## 2019-02-16 NOTE — Progress Notes (Signed)
Occupational Therapy Treatment Patient Details Name: Devon Williams MRN: [id number] DOB: 05-19-1945 Today's Date: 02/16/2019    History of present illness 74 y/o male with PMH including Arthritis, Atrial fibrillation, CKD, Diabetes mellitus without complication, Dysrhythmia (2018). 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   OT comments  Pt very fatigued this day.  Pt sitting inchair. Pt not at level to go home at this time. Will likely need SNF  Follow Up Recommendations  SNF          Precautions / Restrictions Precautions Precautions: Shoulder;Fall Shoulder Interventions: Shoulder sling/immobilizer;At all times;Off for dressing/bathing/exercises Required Braces or Orthoses: Sling Restrictions RUE Weight Bearing: Non weight bearing       Mobility Bed Mobility Overal bed mobility: Needs Assistance Bed Mobility: Supine to Sit     Supine to sit: HOB elevated;+2 for physical assistance;+2 for safety/equipment;Max assist     General bed mobility comments: Max A +2 to transition hips towards EOB and elevate trunk. pt scooted down toward foot of bed on PT arrival   Transfers Overall transfer level: Needs assistance Equipment used: 1 person hand held assist Transfers: Sit to/from Stand Sit to Stand: +2 physical assistance;+2 safety/equipment;From elevated surface;Max assist         General transfer comment: max assist of 2 to achieve knee, hip, trunk extension extension. incr time and multi-modal cues     Balance Overall balance assessment: Needs assistance Sitting-balance support: Single extremity supported;Feet supported Sitting balance-Leahy Scale: Poor Sitting balance - Comments: strong lean, requiring at least min A - mostly mod A to maintain balance EOB (with no awareness of lean) Postural control: Left lateral lean Standing balance support: Single extremity supported;During functional activity Standing balance-Leahy Scale: Poor Standing balance comment:  dependent on external support from therapist and UE support                           ADL either performed or assessed with clinical judgement                  Cognition Arousal/Alertness: Awake/alert Behavior During Therapy: Flat affect;Restless Overall Cognitive Status: Impaired/Different from baseline Area of Impairment: Following commands;Safety/judgement;Awareness;Problem solving;Memory                     Memory: Decreased short-term memory Following Commands: Follows one step commands with increased time;Follows multi-step commands inconsistently Safety/Judgement: Decreased awareness of safety;Decreased awareness of deficits   Problem Solving: Slow processing;Decreased initiation;Difficulty sequencing;Requires verbal cues;Requires tactile cues General Comments: pt does not recall walking yesterday. He states he is "at [facility]" adn had surgery on his " Left shoulder".  nonsensical speech at onset of session        Exercises Shoulder Exercises Elbow Flexion: Right;10 reps;AROM;Supine Elbow Extension: Right;10 reps;AROM;Supine Wrist Flexion: AROM;Right;10 reps;Supine Wrist Extension: AROM;Right;10 reps;Supine Digit Composite Flexion: AROM;Right;Supine;10 reps Composite Extension: AROM;Right;10 reps;Supine Neck Flexion: AROM Neck Extension: AROM Neck Lateral Flexion - Right: AROM Neck Lateral Flexion - Left: AROM   Shoulder Instructions            Pertinent Vitals/ Pain       Pain Assessment: Faces Pain Score: 4  Faces Pain Scale: Hurts a little bit Pain Location: R shoulder  Pain Descriptors / Indicators: Aching;Dull Pain Intervention(s): Limited activity within patient's tolerance;Monitored during session;Repositioned         Frequency  Min 2X/week        Progress Toward Goals  OT Goals(current  goals can now be found in the care plan section)  Progress towards OT goals: Not progressing toward goals - comment(pt needed increased A  this day)  Acute Rehab OT Goals Patient Stated Goal: be able to sleep through the night again  Plan Discharge plan needs to be updated       AM-PAC OT "6 Clicks" Daily Activity     Outcome Measure   Help from another person eating meals?: A Little Help from another person taking care of personal grooming?: A Little Help from another person toileting, which includes using toliet, bedpan, or urinal?: Total Help from another person bathing (including washing, rinsing, drying)?: A Lot Help from another person to put on and taking off regular upper body clothing?: A Lot Help from another person to put on and taking off regular lower body clothing?: Total 6 Click Score: 12    End of Session Equipment Utilized During Treatment: Gait belt;Other (comment)  OT Visit Diagnosis: Muscle weakness (generalized) (M62.81);Pain   Activity Tolerance Patient tolerated treatment well;Other (comment)   Patient Left in chair;with call bell/phone within reach;with chair alarm set;with family/visitor present   Nurse Communication          Time: 4174-0814 OT Time Calculation (min): 18 min  Charges: OT General Charges $OT Visit: 1 Visit OT Treatments $Self Care/Home Management : 8-22 mins  Lise Auer, OT Acute Rehabilitation Services [phone] Office- [phone]      Landis Dowdy, Karin Golden D 02/16/2019, 1:15 PM

## 2019-02-17 ENCOUNTER — Inpatient Hospital Stay (HOSPITAL_COMMUNITY): Payer: No Typology Code available for payment source

## 2019-02-17 DIAGNOSIS — E785 Hyperlipidemia, unspecified: Secondary | ICD-10-CM | POA: Diagnosis present

## 2019-02-17 DIAGNOSIS — F32A Depression, unspecified: Secondary | ICD-10-CM | POA: Diagnosis present

## 2019-02-17 DIAGNOSIS — I480 Paroxysmal atrial fibrillation: Secondary | ICD-10-CM | POA: Diagnosis present

## 2019-02-17 DIAGNOSIS — R791 Abnormal coagulation profile: Secondary | ICD-10-CM

## 2019-02-17 DIAGNOSIS — F329 Major depressive disorder, single episode, unspecified: Secondary | ICD-10-CM | POA: Diagnosis present

## 2019-02-17 DIAGNOSIS — Z96611 Presence of right artificial shoulder joint: Secondary | ICD-10-CM

## 2019-02-17 LAB — CBC
HCT: 39.4 % (ref 39.0–52.0)
Hemoglobin: 13 g/dL (ref 13.0–17.0)
MCH: 32 pg (ref 26.0–34.0)
MCHC: 33 g/dL (ref 30.0–36.0)
MCV: 97 fL (ref 80.0–100.0)
Platelets: 230 10*3/uL (ref 150–400)
RBC: 4.06 MIL/uL — ABNORMAL LOW (ref 4.22–5.81)
RDW: 13.1 % (ref 11.5–15.5)
WBC: 10.9 10*3/uL — ABNORMAL HIGH (ref 4.0–10.5)
nRBC: 0 % (ref 0.0–0.2)

## 2019-02-17 LAB — COMPREHENSIVE METABOLIC PANEL WITH GFR
ALT: 19 U/L (ref 0–44)
AST: 40 U/L (ref 15–41)
Albumin: 2.9 g/dL — ABNORMAL LOW (ref 3.5–5.0)
Alkaline Phosphatase: 76 U/L (ref 38–126)
Anion gap: 9 (ref 5–15)
BUN: 19 mg/dL (ref 8–23)
CO2: 25 mmol/L (ref 22–32)
Calcium: 9 mg/dL (ref 8.9–10.3)
Chloride: 99 mmol/L (ref 98–111)
Creatinine, Ser: 1.18 mg/dL (ref 0.61–1.24)
GFR calc Af Amer: >60 mL/min
GFR calc non Af Amer: >60 mL/min
Glucose, Bld: 220 mg/dL — ABNORMAL HIGH (ref 70–99)
Potassium: 4.2 mmol/L (ref 3.5–5.1)
Sodium: 133 mmol/L — ABNORMAL LOW (ref 135–145)
Total Bilirubin: 1 mg/dL (ref 0.3–1.2)
Total Protein: 7.3 g/dL (ref 6.5–8.1)

## 2019-02-17 LAB — URINALYSIS, ROUTINE W REFLEX MICROSCOPIC
Bacteria, UA: NONE SEEN
Bilirubin Urine: NEGATIVE
Glucose, UA: NEGATIVE mg/dL
Ketones, ur: NEGATIVE mg/dL
Leukocytes,Ua: NEGATIVE
Nitrite: NEGATIVE
Protein, ur: 30 mg/dL — AB
Specific Gravity, Urine: 1.017 (ref 1.005–1.030)
pH: 6 (ref 5.0–8.0)

## 2019-02-17 LAB — D-DIMER, QUANTITATIVE: D-Dimer, Quant: 3.82 ug{FEU}/mL — ABNORMAL HIGH (ref 0.00–0.50)

## 2019-02-17 LAB — GLUCOSE, CAPILLARY
Glucose-Capillary: 131 mg/dL — ABNORMAL HIGH (ref 70–99)
Glucose-Capillary: 168 mg/dL — ABNORMAL HIGH (ref 70–99)
Glucose-Capillary: 145 mg/dL — ABNORMAL HIGH (ref 70–99)
Glucose-Capillary: 127 mg/dL — ABNORMAL HIGH (ref 70–99)

## 2019-02-17 LAB — PROCALCITONIN: Procalcitonin: 0.15 ng/mL

## 2019-02-17 LAB — LACTIC ACID, PLASMA
Lactic Acid, Venous: 1.5 mmol/L (ref 0.5–1.9)
Lactic Acid, Venous: 1.5 mmol/L (ref 0.5–1.9)

## 2019-02-17 MED ORDER — SODIUM CHLORIDE (PF) 0.9 % IJ SOLN
INTRAMUSCULAR | Status: AC
  Filled 2019-02-17: qty 50

## 2019-02-17 MED ORDER — IOHEXOL 350 MG/ML SOLN
100.0000 mL | Freq: Once | INTRAVENOUS | Status: AC | PRN
  Administered 2019-02-17: 100 mL via INTRAVENOUS

## 2019-02-17 MED ORDER — DOCUSATE SODIUM 100 MG PO CAPS: 100.0000 mg | ORAL_CAPSULE | Freq: Two times a day (BID) | ORAL | Status: DC | PRN

## 2019-02-17 NOTE — Consult Note (Signed)
Medical Consultation   Devon Williams  ZOX:[id number]  DOB: [birth year]  DOA: 02/13/2019  PCP: Elijio Miles., MD   Requesting physician: Beverely Low, MD   Reason for consultation: Fever    History of Present Illness: Devon Williams is an 74 y.o. male with past medical history remarkable for type 2 diabetes mellitus, BPH, HLD, depression, CKD stage IIIa, proximal atrial fibrillation on chronic anticoagulation with Eliquis.  Patient is postoperative day #4 for reverse shoulder arthroplasty by orthopedics.  Hospitalist service consulted for postoperative fever.  Patient seen and examined at bedside, eating breakfast.  He is alert and oriented.  Complains of some mild diarrhea, but has been receiving scheduled stool softeners.  Denies any pain to his right shoulder joint.  When asked if he has any issues with urination (dysuria/frequency), he replies with "I always have problems with urination".  Further denies headache, no chills/night sweats, no nausea/vomiting, no chest pain, no palpitations, no shortness of breath, no cough/congestion, no lower extremity edema, no weakness, no fatigue.   Review of Systems:  ROS As per HPI otherwise 10 point review of systems negative.     Past Medical History: Past Medical History:  Diagnosis Date  . Arthritis   . Atrial fibrillation (HCC)   . Chronic kidney disease   . Diabetes mellitus without complication (HCC)   . Dysrhythmia 2018   Pt has SVT then a card. loop recorder implanted at the [place]  . GERD (gastroesophageal reflux disease)     Past Surgical History: Past Surgical History:  Procedure Laterality Date  . EYE SURGERY Left    cataract-to have rt done 09/04/15  . GANGLION CYST EXCISION Right    hand  . JOINT REPLACEMENT    . KNEE ARTHROSCOPY Left    x3  . REVERSE SHOULDER ARTHROPLASTY Right 02/13/2019   Procedure: REVERSE SHOULDER ARTHROPLASTY;  Surgeon: Beverely Low, MD;  Location: [facility];  Service: Orthopedics;   Laterality: Right;  interscalene block  . TOTAL KNEE ARTHROPLASTY Left 10/14/2015   Procedure: LEFT TOTAL KNEE ARTHROPLASTY;  Surgeon: Beverely Low, MD;  Location: [facility] OR;  Service: Orthopedics;  Laterality: Left;     Allergies:   Allergies  Allergen Reactions  . Nabumetone Other (See Comments)    UNSPECIFIED REACTION      Social History:  reports that he quit smoking about 42 years ago. His smoking use included cigarettes. He smoked 1.00 pack per day. He quit smokeless tobacco use about 9 years ago. He reports current alcohol use of about 5.0 standard drinks of alcohol per week. He reports that he does not use drugs.   Family History: Family History  Problem Relation Age of Onset  . Diabetes Mother   . Ovarian cancer Mother   . Lung cancer Father     Family history reviewed and not pertinent    Physical Exam: Vitals:   02/16/19 2230 02/17/19 0007 02/17/19 0204 02/17/19 0524  BP:  (!) 107/40 140/77 (!) 146/65  Pulse: 78 63 82 68  Resp: 18 16 18 16   Temp: (!) 102.9 F (39.4 C) (!) 100.4 F (38 C) (!) 101.4 F (38.6 C) 100.1 F (37.8 C)  TempSrc: Oral Oral Oral Oral  SpO2: 96% 95% 97% 92%  Weight:      Height:        GEN: 74 yo CM in NAD, alert and oriented x 3, wd/wn HEENT: NCAT, PERRL, EOMI, sclera  clear, MMM Neck: neck appears normal, no masses, normal ROM, no thyromegaly, no JVD  PULM: CTAB w/o wheezes/crackles, normal respiratory effort, on 1 L nasal cannula CV: RRR w/o M/G/R GI: abd soft, NTND, NABS, no R/G/M MSK: Trace peripheral edema bilateral lower extremities, muscle strength globally intact 5/5 bilateral upper/lower extremities, right shoulder in sling.  Right shoulder wound noted with dressing in place, clean/dry/intact NEURO: CN II-XII intact, no focal deficits, sensation to light touch intact PSYCH: normal mood/affect, judgement and insight appear normal Integumentary: dry/intact, no rashes or wounds    Data reviewed:  I have personally reviewed  following labs and imaging studies Labs:  CBC: Recent Labs  Lab 02/12/19 1147 02/14/19 0440 02/16/19 1551  WBC 8.9  --  14.1*  NEUTROABS  --   --  12.0*  HGB 14.9 12.4* 12.2*  HCT 44.7 37.8* 36.8*  MCV 94.9  --  95.1  PLT 283  --  210    Basic Metabolic Panel: Recent Labs  Lab 02/12/19 1147 02/12/19 1147 02/14/19 0440 02/16/19 1551  NA 138  --  135 132*  K 4.6   < > 4.3 4.2  CL 102  --  103 99  CO2 28  --  23 24  GLUCOSE 191*  --  138* 136*  BUN 13  --  13 17  CREATININE 1.13  --  1.10 1.33*  CALCIUM 9.8  --  8.9 9.0   < > = values in this interval not displayed.   GFR Estimated Creatinine Clearance: 64.3 mL/min (A) (by C-G formula based on SCr of 1.33 mg/dL (H)). Liver Function Tests: No results for input(s): AST, ALT, ALKPHOS, BILITOT, PROT, ALBUMIN in the last 168 hours. No results for input(s): LIPASE, AMYLASE in the last 168 hours. No results for input(s): AMMONIA in the last 168 hours. Coagulation profile No results for input(s): INR, PROTIME in the last 168 hours.  Cardiac Enzymes: No results for input(s): CKTOTAL, CKMB, CKMBINDEX, TROPONINI in the last 168 hours. BNP: Invalid input(s): POCBNP CBG: Recent Labs  Lab 02/16/19 0723 02/16/19 1147 02/16/19 1700 02/16/19 2206 02/17/19 0739  GLUCAP 86 117* 126* 116* 131*   D-Dimer No results for input(s): DDIMER in the last 72 hours. Hgb A1c No results for input(s): HGBA1C in the last 72 hours. Lipid Profile No results for input(s): CHOL, HDL, LDLCALC, TRIG, CHOLHDL, LDLDIRECT in the last 72 hours. Thyroid function studies No results for input(s): TSH, T4TOTAL, T3FREE, THYROIDAB in the last 72 hours.  Invalid input(s): FREET3 Anemia work up No results for input(s): VITAMINB12, FOLATE, FERRITIN, TIBC, IRON, RETICCTPCT in the last 72 hours. Urinalysis    Component Value Date/Time   COLORURINE STRAW (A) 02/14/2019 1650   APPEARANCEUR CLEAR 02/14/2019 1650   LABSPEC 1.005 02/14/2019 1650   PHURINE  6.0 02/14/2019 1650   GLUCOSEU NEGATIVE 02/14/2019 1650   HGBUR NEGATIVE 02/14/2019 1650   BILIRUBINUR NEGATIVE 02/14/2019 1650   KETONESUR NEGATIVE 02/14/2019 1650   PROTEINUR NEGATIVE 02/14/2019 1650   NITRITE NEGATIVE 02/14/2019 1650   LEUKOCYTESUR NEGATIVE 02/14/2019 1650     Microbiology Recent Results (from the past 240 hour(s))  Novel Coronavirus, NAA (Hosp order, Send-out to Ref Lab; TAT 18-24 hrs     Status: None   Collection Time: 02/10/19  8:37 AM   Specimen: Nasopharyngeal Swab; Respiratory  Result Value Ref Range Status   SARS-CoV-2, NAA NOT DETECTED NOT DETECTED Final    Comment: (NOTE) Testing was performed using the cobas(R) SARS-CoV-2 test. This nucleic acid  amplification test was developed and its performance characteristics determined by [employer]. Nucleic acid amplification tests include RT-PCR and TMA. This test has not been FDA cleared or approved. This test has been authorized by FDA under an Emergency Use Authorization (EUA). This test is only authorized for the duration of time the declaration that circumstances exist justifying the authorization of the emergency use of in vitro diagnostic tests for detection of SARS-CoV-2 virus and/or diagnosis of COVID-19 infection under section 564(b)(1) of the Act, 21 U.S.C. 948NIO-2(V) (1), unless the authorization is terminated or revoked sooner. When diagnostic testing is negative, the possibility of a false negative result should be considered in the context of a patient's recent exposures and the presence of clinical signs and symptoms consistent with COVID-19. An individual withou t symptoms of COVID- 19 and who is not shedding SARS-CoV-2 virus would expect to have a negative (not detected) result in this assay. Performed At: [facility] [address] [place], [place] [id number] Rush Farmer MD WE:[id number]    [place]  Final    Comment: Performed at [facility], [place] [address] [place], [place] [address]  Surgical pcr screen     Status: Abnormal   Collection Time: 02/12/19 11:11 AM   Specimen: Nasal Mucosa; Nasal Swab  Result Value Ref Range Status   MRSA, PCR NEGATIVE NEGATIVE Final   Staphylococcus aureus POSITIVE (A) NEGATIVE Final    Comment: (NOTE) The Xpert SA Assay (FDA approved for NASAL specimens in patients 69 years of age and older), is one component of a comprehensive surveillance program. It is not intended to diagnose infection nor to guide or monitor treatment. Performed at [facility], [place] [address], [place], [place] [address]        Inpatient Medications:   Scheduled Meds: . acetaminophen  1,000 mg Oral Q6H  . apixaban  5 mg Oral BID  . atorvastatin  40 mg Oral QHS  . cholecalciferol  5,000 Units Oral Daily  . darifenacin  7.5 mg Oral Daily  . gabapentin  300 mg Oral BID WC  . gabapentin  600 mg Oral QHS  . insulin aspart  0-15 Units Subcutaneous TID WC  . insulin aspart  0-5 Units Subcutaneous QHS  . insulin aspart  4 Units Subcutaneous TID WC  . loratadine  10 mg Oral Daily  . metFORMIN  500 mg Oral BID WC  . omega-3 acid ethyl esters  2 g Oral BID  . pantoprazole  40 mg Oral Daily  . tamsulosin  0.4 mg Oral QHS  . tiZANidine  2 mg Oral QHS  . traZODone  150 mg Oral QHS  . venlafaxine XR  150 mg Oral BID   Continuous Infusions: . sodium chloride Stopped (02/15/19 0800)  . methocarbamol (ROBAXIN) IV       Radiological Exams on Admission: DG Chest 2 View  Result Date: 02/16/2019 CLINICAL DATA:  Hypoxia. History of atrial fibrillation, diabetes and chronic kidney disease. EXAM: CHEST - 2 VIEW COMPARISON:  Radiographs 02/14/2019, 11/05/2017 and 10/18/2015. FINDINGS: The lateral view is limited by suboptimal inspiration and the patient's right arm. The heart size and mediastinal contours are stable. There is mild bibasilar atelectasis which does not appear significantly  changed. No confluent airspace opacity, pneumothorax or significant pleural effusion. Degenerative changes are present within the spine. Patient is status post recent right shoulder reverse arthroplasty. IMPRESSION: Stable mild bibasilar atelectasis. No acute findings. Electronically Signed   By: Richardean Sale  M.D.   On: 02/16/2019 16:14    Impression/Recommendations Principal Problem:   S/P shoulder replacement, right Active Problems:   Essential hypertension   DM (diabetes mellitus), type 2 with renal complications (HCC)   CKD (chronic kidney disease), stage III   AF (paroxysmal atrial fibrillation) (HCC)   HLD (hyperlipidemia)   Depression  Fever, unknown clear etiology Leukocytosis Patient postop day #4 for reverse shoulder arthroplasty.  Started developing fevers and now leukocytosis postoperatively.  T-max last 24 hours 102.9, currently 100.1.  Is receiving scheduled Tylenol postoperatively.  Urinalysis on admission unrevealing.  Covid-19  negative.  Chest x-ray yesterday notes by basilar atelectasis without acute findings.  WBC count yesterday 14.1.  Currently on 1 L nasal cannula oxygenating 92%.  Patient with no specific complaints other than some diarrhea, but has been receiving laxatives.  Broad differential at this point with postoperative atelectasis, UTI, pneumonia, versus adverse reaction to medications such as narcotics.  Also considered wound infection, although wound appears clean/dry/intact without fluctuance or surrounding erythema. --Repeat CBC, CMP, chest x-ray, urinalysis with culture, blood cultures x2, procalcitonin, lactic acid --Check D-dimer, low suspicion for DVT/PE given on chronic anticoagulation with Eliquis --Low suspicion for C. difficile colitis; although has diarrhea has been getting scheduled laxatives; hold off on C. difficile testing for now and change schedule laxatives to as needed --Continue incentive spirometer --Limit narcotics as much as  possible --Wean supplemental oxygen, goal SPO2 greater than 92% --6-minute walk test today --Will hold on initiation of empiric antibiotics pending further lab work as above  Hyponatremia Sodium 132 on labs yesterday.  We will repeat BMP today.  Suspect hypovolemic hyponatremia in the setting of poor oral intake surrounding recent surgical intervention for right shoulder replacement. --will initiate IV fluid hydration if remains hyponatremic on labs today  Type 2 diabetes mellitus Reported hypoglycemic event yesterday, glucose 69.  Home medications include Alogliptin 12.5 mg p.o. daily, glipizide 10 mg p.o. twice daily, Metformin 500 mg p.o. twice daily.  Globin A1c 6.5 on 02/12/2019, well controlled. --Discontinue oral hypoglycemics secondary to episode of hypoglycemia, while inpatient --Continue moderate insulin sliding scale for coverage  HLD: Continue statin  CKD stage IIIa --Cr slightly trending up during hospitalization 1.13-->1.10-->1.33 --Repeat BMP today, if further rise will consider further IV fluid hydration  Paroxysmal atrial fibrillation EKG on admission, notable for NSR. --Continue anticoagulation with Eliquis   Thank you for the opportunity to participate in the care of this most interesting patient.  The [facility] hospitalist team will continue to follow the patient with you.   Time spent: 70 minutes spent on chart review, discussion with nursing staff, consultants, updating family and interview/physical exam; more than 50% of that time was spent in counseling and/or coordination of care.   Alvira Philips [place] DO [facility] Available via Epic secure chat 7am-7pm After these hours, please refer to coverage provider listed on [email or web address] 02/17/2019, 8:54 AM

## 2019-02-17 NOTE — Progress Notes (Signed)
Physical Therapy Treatment Patient Details Name: Devon Williams MRN: [id number] DOB: 1945-10-05 Today's Date: 02/17/2019    History of Present Illness 74 y/o male with PMH including Arthritis, Atrial fibrillation, CKD, Diabetes mellitus without complication, Dysrhythmia (2018). 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    PT Comments    Pt continues to require assist of 2 to perform basic stand and stand  pivot transfers. Extremely unsteady in standing and requires multi-modal cues for basic tasks despite the fact that he is oriented to person and place. Pt has no recall of PT or having any therapy for the past 3 days. Continue to recommnend SNF post acute; if home they will need to hire 24 hour assist, obtain a hospital bed, w/c and possibly a hoyer lift.   Follow Up Recommendations  SNF;Supervision/Assistance - 24 hour     Equipment Recommendations  None recommended by PT    Recommendations for Other Services       Precautions / Restrictions Precautions Precautions: Shoulder;Fall Precaution Comments: sling adjusted, pt with RUE almost out of sling Required Braces or Orthoses: Sling Restrictions RUE Weight Bearing: Non weight bearing    Mobility  Bed Mobility Overal bed mobility: Needs Assistance Bed Mobility: Supine to Sit     Supine to sit: HOB elevated;Min assist;Mod assist     General bed mobility comments: multi-modal cues, pt incontinent of stool in bed. assisted with LEs off bed and elevation of trunk  Transfers Overall transfer level: Needs assistance Equipment used: 1 person hand held assist Transfers: Sit to/from [employer] Sit to Stand: Mod assist;Max assist;+2 physical assistance;+2 safety/equipment Stand pivot transfers: Mod assist;+2 safety/equipment;+2 physical assistance       General transfer comment: mod/max assist of 2 to achieve knee, hip, trunk extension extension. incr time and multi-modal cues   Ambulation/Gait                 Stairs             Wheelchair Mobility    Modified Rankin (Stroke Patients Only)       Balance           Standing balance support: Single extremity supported;During functional activity Standing balance-Leahy Scale: Poor Standing balance comment: dependent on external support from therapist and UE support, initial anterior LOB--multi-modal cues for trunk/knee/hip extension                            Cognition Arousal/Alertness: Awake/alert Behavior During Therapy: Restless Overall Cognitive Status: Impaired/Different from baseline Area of Impairment: Following commands;Safety/judgement;Awareness;Problem solving;Memory                     Memory: Decreased short-term memory Following Commands: Follows multi-step commands inconsistently;Follows one step commands inconsistently Safety/Judgement: Decreased awareness of safety;Decreased awareness of deficits   Problem Solving: Slow processing;Decreased initiation;Difficulty sequencing;Requires verbal cues;Requires tactile cues General Comments: pt is oriented to person and place today, unable to state why  he is at [facility].  cooperative and following most commands,  pt does not recall PT or doing any therapy over the past 3-4 days       Exercises      General Comments        Pertinent Vitals/Pain Pain Assessment: No/denies pain    Home Living                      Prior Function  PT Goals (current goals can now be found in the care plan section) Acute Rehab PT Goals Patient Stated Goal: be able to sleep through the night again PT Goal Formulation: With patient Time For Goal Achievement: 02/27/19 Potential to Achieve Goals: Good Progress towards PT goals: Progressing toward goals    Frequency    Min 3X/week      PT Plan Current plan remains appropriate;Discharge plan needs to be updated    Co-evaluation              AM-PAC PT "6 Clicks"  Mobility   Outcome Measure  Help needed turning from your back to your side while in a flat bed without using bedrails?: A Lot Help needed moving from lying on your back to sitting on the side of a flat bed without using bedrails?: A Lot Help needed moving to and from a bed to a chair (including a wheelchair)?: A Lot Help needed standing up from a chair using your arms (e.g., wheelchair or bedside chair)?: A Lot Help needed to walk in hospital room?: A Lot Help needed climbing 3-5 steps with a railing? : Total 6 Click Score: 11    End of Session Equipment Utilized During Treatment: Gait belt Activity Tolerance: Patient tolerated treatment well Patient left: with call bell/phone within reach;in chair;with chair alarm set;with family/visitor present Nurse Communication: Mobility status PT Visit Diagnosis: Other abnormalities of gait and mobility (R26.89)     Time: 3419-3790 PT Time Calculation (min) (ACUTE ONLY): 17 min  Charges:  $Therapeutic Activity: 8-22 mins                     Baxter Flattery, PT   Acute Rehab Dept [facility]): [phone]   02/17/2019    [facility] 02/17/2019, 2:02 PM

## 2019-02-17 NOTE — Progress Notes (Signed)
OT Cancellation Note  Patient Details Name: Devon Williams MRN: [id number] DOB: [birth year]   Cancelled Treatment:    Reason Eval/Treat Not Completed: Medical issues which prohibited therapy.  CT scan ordered to r/o PE. Will check back tomorrow.  Dorthula Bier 02/17/2019, 1:55 PM  Roxana Hires, OTR/L Acute Rehabilitation Services 02/17/2019

## 2019-02-17 NOTE — Progress Notes (Signed)
Bilateral lower extremity venous duplex has been completed. Preliminary results can be found in CV Proc through chart review.   02/17/19 1:56 PM Olen Cordial RVT

## 2019-02-17 NOTE — Progress Notes (Signed)
Orthopedics Progress Note  Subjective: Patient denies any pain in the shoulder or other complaints  Objective:  Vitals:   02/17/19 0204 02/17/19 0524  BP: 140/77 (!) 146/65  Pulse: 82 68  Resp: 18 16  Temp: (!) 101.4 F (38.6 C) 100.1 F (37.8 C)  SpO2: 97% 92%    General: Awake and alert, thinks he's in [place] hospital Musculoskeletal: Right shoulder dressing intact, no swelling and no drainage, distally NVI Able to do knee bending and calf pumps with no pain Neurovascularly intact  Lab Results  Component Value Date   WBC 14.1 (H) 02/16/2019   HGB 12.2 (L) 02/16/2019   HCT 36.8 (L) 02/16/2019   MCV 95.1 02/16/2019   PLT 210 02/16/2019       Component Value Date/Time   NA 132 (L) 02/16/2019 1551   K 4.2 02/16/2019 1551   CL 99 02/16/2019 1551   CO2 24 02/16/2019 1551   GLUCOSE 136 (H) 02/16/2019 1551   BUN 17 02/16/2019 1551   CREATININE 1.33 (H) 02/16/2019 1551   CALCIUM 9.0 02/16/2019 1551   GFRNONAA 53 (L) 02/16/2019 1551   GFRAA >60 02/16/2019 1551    Lab Results  Component Value Date   INR 2.27 10/26/2015   INR 2.50 10/25/2015   INR 2.61 10/24/2015    Assessment/Plan: POD #4 s/p Procedure(s): REVERSE SHOULDER ARTHROPLASTY Will ask internal medicine to see patient for temp spikes and WBC elevation. Urine checked a few days ago a clear. Atelectasis may be the source of the fevers. Hold Discharge until medically stable. Continue PT/mobilization  Almedia Balls. Ranell Patrick, MD 02/17/2019 7:50 AM

## 2019-02-18 DIAGNOSIS — Z96611 Presence of right artificial shoulder joint: Secondary | ICD-10-CM

## 2019-02-18 LAB — GLUCOSE, CAPILLARY
Glucose-Capillary: 117 mg/dL — ABNORMAL HIGH (ref 70–99)
Glucose-Capillary: 171 mg/dL — ABNORMAL HIGH (ref 70–99)

## 2019-02-18 NOTE — Progress Notes (Signed)
Ortho Attending Note  Based upon PT notes, patient still requiring 2 person Max assist for getting up. He will not be safe at home unless 24 hour care available and hoyer lift. Recommend short term SNF at this time. Family aware.   Await Social Work Consult Continue therapy  Verlee Rossetti MD [facility]

## 2019-02-18 NOTE — Progress Notes (Signed)
Patient discharged to home w/ family. Given all belongings, instructions, prescriptions. Verbalized understanding of all instructions. Escorted to pov via w/c. 

## 2019-02-18 NOTE — TOC Initial Note (Signed)
Transition of Care [facility]) - Initial/Assessment Note    Patient Details  Name: Devon Williams MRN: [id number] Date of Birth: Oct 24, 1945  Transition of Care [facility]) CM/SW Contact:    Lia Hopping, [place] Phone Number: 02/18/2019, 2:08 PM  Clinical Narrative:                 CSW met with patient at bedside. Patient alert x3. CSW introduced role and reason for visit. Patient declines SNF placement. Patient gave CSW permission to talk with his spouse Judeen Hammans. CSW reached out to the patient spouse to discuss SNF. Patient spouse reports she prefers the patient return home. She reports she has additional support from her son that lives with her and other relatives that live in short distance from their home. She reports they have DME-lift chair and bedside commode. She would like to talk with the physician about the patient returning home instead of going to SNF. CSW notified the nursing staff.   Expected Discharge Plan: Skilled Nursing Facility Barriers to Discharge: Insurance Authorization   Patient Goals and CMS Choice        Expected Discharge Plan and Services Expected Discharge Plan: [place] In-house Referral: Clinical Social Work Discharge Planning Services: CM Consult   Living arrangements for the past 2 months: [place] Expected Discharge Date: 02/16/19                                    Prior Living Arrangements/Services Living arrangements for the past 2 months: Single Family Home Lives with:: Spouse Patient language and need for interpreter reviewed:: No Do you feel safe going back to the place where you live?: Yes      Need for Family Participation in Patient Care: Yes (Comment) Care giver support system in place?: Yes (comment) Current home services: DME Criminal Activity/Legal Involvement Pertinent to Current Situation/Hospitalization: No - Comment as needed  Activities of Daily Living Home Assistive Devices/Equipment: Dentures (specify  type), CBG Meter, Eyeglasses, Hearing aid, Walker (specify type), Cane (specify quad or straight) ADL Screening (condition at time of admission) Patient's cognitive ability adequate to safely complete daily activities?: Yes Is the patient deaf or have difficulty hearing?: Yes Does the patient have difficulty seeing, even when wearing glasses/contacts?: No Does the patient have difficulty concentrating, remembering, or making decisions?: No Patient able to express need for assistance with ADLs?: Yes Does the patient have difficulty dressing or bathing?: No Independently performs ADLs?: Yes (appropriate for developmental age) Does the patient have difficulty walking or climbing stairs?: No Weakness of Legs: None Weakness of Arms/Hands: None  Permission Sought/Granted Permission sought to share information with : Case Manager Permission granted to share information with : Yes, Verbal Permission Granted  Share Information with NAME: Judeen Hammans     Permission granted to share info w Relationship: Spouse     Emotional Assessment Appearance:: Appears stated age Attitude/Demeanor/Rapport: Engaged Affect (typically observed): Accepting Orientation: : Oriented to Self, Oriented to Place Alcohol / Substance Use: Not Applicable Psych Involvement: No (comment)  Admission diagnosis:  S/P shoulder replacement, right [Z96.611] Patient Active Problem List   Diagnosis Date Noted  . AF (paroxysmal atrial fibrillation) ([place]) 02/17/2019  . HLD (hyperlipidemia) 02/17/2019  . Depression 02/17/2019  . S/P shoulder replacement, right 02/13/2019  . Pneumonia   . Essential hypertension 10/17/2015  . Acute encephalopathy 10/17/2015  . DM (diabetes mellitus), type 2 with renal complications ([place]) 17/79/3903  .  CKD (chronic kidney disease), stage III 10/17/2015  . H/O total knee replacement 10/14/2015   PCP:  Derrill Center., MD Pharmacy:   [place], [place]  [phone] [place] [address] Phone: [phone] Fax: [phone]     Social Determinants of Health (SDOH) Interventions    Readmission Risk Interventions No flowsheet data found.

## 2019-02-18 NOTE — Progress Notes (Signed)
   Subjective: 5 Days Post-Op Procedure(s) (LRB): REVERSE SHOULDER ARTHROPLASTY (Right)  Pt refusing SNF and was much more lucid today Patient reports pain as mild.  Objective:   VITALS:   Vitals:   02/18/19 0922 02/18/19 1344  BP: 131/70 104/70  Pulse: 70 88  Resp: 18 16  Temp: 98.3 F (36.8 C) 98 F (36.7 C)  SpO2: 96% 98%     LABS Recent Labs    02/16/19 1551 02/17/19 1015  HGB 12.2* 13.0  HCT 36.8* 39.4  WBC 14.1* 10.9*  PLT 210 230    Recent Labs    02/16/19 1551 02/17/19 1015  NA 132* 133*  K 4.2 4.2  BUN 17 19  CREATININE 1.33* 1.18  GLUCOSE 136* 220*     Assessment/Plan: 5 Days Post-Op Procedure(s) (LRB): REVERSE SHOULDER ARTHROPLASTY (Right) Plan to d/c home in care of wife F/u in 2 weeks in the office    Brad Durwin Nora PA-C, MPAS [facility] is now [facility]  [place] [address], [address], [place], [place] [address] Phone: [phone] [email or web address] Facebook  [employer]

## 2019-02-18 NOTE — Progress Notes (Signed)
PROGRESS NOTE  [name]  DOB: [birth year]  PCP: Elijio Miles., MD WUJ:[id number]  DOA: 02/13/2019 Admitted From: Home  LOS: 5 days   No chief complaint on file.  Brief narrative: Devon Williams is an 74 y.o. male with PMH remarkable for type 2 diabetes mellitus, BPH, HLD, depression, CKD stage IIIa, paroxysmal atrial fibrillation on chronic anticoagulation with Eliquis. Patient also had right shoulder rotator cuff tear and claims no humeral arthritis. 1/23, patient underwent right reverse total shoulder arthroplasty with subscapularis repair.  Patient was admitted under orthopedic service. 1/26, hospitalist service was consulted for postoperative fever.  Subjective: Patient was seen and examined this morning.  Elderly Caucasian male.  Sitting up in chair.  Not in distress.  Hard of hearing.  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  Right shoulder immobilized. No fever episode last 24 hours.  Last temperature spike was in the morning of 1/26.  Assessment/Plan: Fever, unknown clear etiology Leukocytosis -1/23, underwent right shoulder arthroplasty. -1/25, fever spikes up to 102.9. -WBC peaked at 14.1 on 1/25, trending down. -Urinalysis unrevealing, Covid-19  negative.   -CT chest 1/26 was negative for pulmonary embolism or any acute infiltrate.   -No evidence of wound infection per orthopedics team. -Lactic acid level normal at 1.5, procalcitonin was normal at 0.15. -Diarrhea slowed down.  No suspicion of C. Difficile. -Currently not on antibiotics.  Acute respiratory failure with hypoxia -On 2 L oxygen by nasal cannula.  Wean down as tolerated.  Hyponatremia -Postoperatively, sodium level falling down, 133 yesterday.  Expect improvement with fluid.   -Repeat BMP tomorrow.    Type 2 diabetes mellitus -A1c 6.5 -Home medications include Alogliptin 12.5 mg p.o. daily, glipizide 10 mg p.o. twice daily, Metformin 500 mg p.o. twice daily.   -Hold oral hypoglycemics secondary to episode  of hypoglycemia, while inpatient -Continue moderate insulin sliding scale for coverage  Paroxysmal A. Fib -Home meds include Eliquis.  Not on any rate/rhythm control medicine.  HLD: Continue statin  CKD stage IIIa -Cr stable 1.13-->1.10-->1.33 >>1.18  Paroxysmal atrial fibrillation -EKG on admission, notable for NSR. -Continue anticoagulation with Eliquis  Mobility: Increase ambulation.  SNF recommended PT Diet: Cardiac/diabetic diet Fluid: Not on IV fluids DVT prophylaxis:  Eliquis Code Status:  Full code Family Communication:  Expected Discharge:  Defer to primary team.  Antimicrobials: Anti-infectives (From admission, onward)   Start     Dose/Rate Route Frequency Ordered Stop   02/14/19 1200  ceFAZolin (ANCEF) IVPB 2g/100 mL premix     2 g 200 mL/hr over 30 Minutes Intravenous Every 8 hours 02/14/19 0815 02/15/19 0647   02/13/19 1600  ceFAZolin (ANCEF) IVPB 2g/100 mL premix     2 g 200 mL/hr over 30 Minutes Intravenous Every 6 hours 02/13/19 1443 02/14/19 0601   02/13/19 0730  ceFAZolin (ANCEF) IVPB 2g/100 mL premix     2 g 200 mL/hr over 30 Minutes Intravenous On call to O.R. 02/13/19 0721 02/13/19 0952        Code Status: Full Code   Diet Order            Diet - low sodium heart healthy        Diet Carb Modified Fluid consistency: Thin; Room service appropriate? Yes  Diet effective now              Infusions:  . sodium chloride Stopped (02/15/19 0800)  . methocarbamol (ROBAXIN) IV      Scheduled Meds: . acetaminophen  1,000 mg Oral Q6H  .  apixaban  5 mg Oral BID  . atorvastatin  40 mg Oral QHS  . cholecalciferol  5,000 Units Oral Daily  . darifenacin  7.5 mg Oral Daily  . gabapentin  300 mg Oral BID WC  . gabapentin  600 mg Oral QHS  . insulin aspart  0-15 Units Subcutaneous TID WC  . insulin aspart  0-5 Units Subcutaneous QHS  . insulin aspart  4 Units Subcutaneous TID WC  . loratadine  10 mg Oral Daily  . omega-3 acid ethyl esters  2 g  Oral BID  . pantoprazole  40 mg Oral Daily  . tamsulosin  0.4 mg Oral QHS  . tiZANidine  2 mg Oral QHS  . traZODone  150 mg Oral QHS  . venlafaxine XR  150 mg Oral BID    PRN meds: bisacodyl, docusate sodium, HYDROmorphone (DILAUDID) injection, menthol-cetylpyridinium **OR** phenol, methocarbamol **OR** methocarbamol (ROBAXIN) IV, metoCLOPramide **OR** metoCLOPramide (REGLAN) injection, ondansetron **OR** ondansetron (ZOFRAN) IV, oxyCODONE, polyethylene glycol   Objective: Vitals:   02/18/19 0922 02/18/19 1344  BP: 131/70 104/70  Pulse: 70 88  Resp: 18 16  Temp: 98.3 F (36.8 C) 98 F (36.7 C)  SpO2: 96% 98%    Intake/Output Summary (Last 24 hours) at 02/18/2019 1356 Last data filed at 02/18/2019 0924 Gross per 24 hour  Intake 1620 ml  Output 2001 ml  Net -381 ml   Filed Weights   02/13/19 0804  Weight: 113.4 kg   Weight change:  Body mass index is 34.87 kg/m.   Physical Exam: General exam: Appears calm and comfortable.  Skin: No rashes, lesions or ulcers. HEENT: Atraumatic, normocephalic, supple neck, no obvious bleeding Lungs: Clear to auscultation bilaterally CVS: Regular rate and rhythm, no murmur GI/Abd soft, nontender, nondistended, bowel sound present CNS: Alert, awake, oriented to place and person, hard of hearing Psychiatry: Mood appropriate Extremities: No pedal edema, no calf tenderness right shoulder on brace.  Data Review: I have personally reviewed the laboratory data and studies available.  Recent Labs  Lab 02/12/19 1147 02/14/19 0440 02/16/19 1551 02/17/19 1015  WBC 8.9  --  14.1* 10.9*  NEUTROABS  --   --  12.0*  --   HGB 14.9 12.4* 12.2* 13.0  HCT 44.7 37.8* 36.8* 39.4  MCV 94.9  --  95.1 97.0  PLT 283  --  210 230   Recent Labs  Lab 02/12/19 1147 02/14/19 0440 02/16/19 1551 02/17/19 1015  NA 138 135 132* 133*  K 4.6 4.3 4.2 4.2  CL 102 103 99 99  CO2 28 23 24 25   GLUCOSE 191* 138* 136* 220*  BUN 13 13 17 19   CREATININE 1.13  1.10 1.33* 1.18  CALCIUM 9.8 8.9 9.0 9.0    Terrilee Croak, MD  [facility] 02/18/2019

## 2019-02-18 NOTE — Progress Notes (Signed)
   Subjective: 5 Days Post-Op Procedure(s) (LRB): REVERSE SHOULDER ARTHROPLASTY (Right)  Pt still requiring assistance to ambulate and complete ADL's C/o mild pain in the right shoulder Appreciate medical team assitance Plan for SNF stay after discharge Doppler scans were negative yesterday and leukocystosis improved Patient reports pain as mild.  Objective:   VITALS:   Vitals:   02/18/19 0602 02/18/19 0922  BP: (!) 151/86 131/70  Pulse: 66 70  Resp: 18 18  Temp: 98.4 F (36.9 C) 98.3 F (36.8 C)  SpO2: 98% 96%    Right shoulder incision healing well Dressing and sling in place nv intact distally No rashes or edema  LABS Recent Labs    02/16/19 1551 02/17/19 1015  HGB 12.2* 13.0  HCT 36.8* 39.4  WBC 14.1* 10.9*  PLT 210 230    Recent Labs    02/16/19 1551 02/17/19 1015  NA 132* 133*  K 4.2 4.2  BUN 17 19  CREATININE 1.33* 1.18  GLUCOSE 136* 220*     Assessment/Plan: 5 Days Post-Op Procedure(s) (LRB): REVERSE SHOULDER ARTHROPLASTY (Right) Currently recommending SNF placement Will await social work and FL2 and plan for d/c once bed available Dr. Ranell Patrick discussed condition with wife  Continue PT/OT We will monitor his progress   Alphonsa Overall PA-C, MPAS [facility] is now [facility] [address], [place], [place] [address] Phone: [phone] [email or web address] Facebook  [employer]

## 2019-02-18 NOTE — Discharge Summary (Signed)
Orthopedic Discharge Summary        Physician Discharge Summary  Patient ID: Devon Williams MRN: [id number] DOB/AGE: 03/08/1945 74 y.o.  Admit date: 02/13/2019 Discharge date: 02/18/2019   Procedures:  Procedure(s) (LRB): REVERSE SHOULDER ARTHROPLASTY (Right)  Attending Physician:  Dr. Esmond Plants  Admission Diagnoses:   Right shoulder cuff arthropathy   Discharge Diagnoses:  Right shoulder cuff arthropathy   Past Medical History:  Diagnosis Date  . Arthritis   . Atrial fibrillation ([place])   . Chronic kidney disease   . Diabetes mellitus without complication ([place])   . Dysrhythmia 2018   Pt has SVT then a card. loop recorder implanted at the [place]  . GERD (gastroesophageal reflux disease)     PCP: Derrill Center., MD   Discharged Condition: fair  Hospital Course:  Patient underwent the above stated procedure on 02/13/2019. Patient tolerated the procedure well and brought to the recovery room in good condition and subsequently to the floor. Patient developed fever and leukocytosis post op with negative work up by primary team and medical team that was consulted. Initially pt was 2+ assist with therapy and improved dramatically on discharge day. Plan for home health and lift chair at home  Disposition: Discharge disposition: 01-Home or Self Care      with follow up in 2 weeks   Follow-up Information    Netta Cedars, MD. Call in 2 weeks.   Specialty: Orthopedic Surgery Why: [phone] Contact information: [address] [place] [address] [phone]           Discharge Instructions    Call MD / Call 911   Complete by: As directed    If you experience chest pain or shortness of breath, CALL 911 and be transported to the hospital emergency room.  If you develope a fever above 101 F, pus (white drainage) or increased drainage or redness at the wound, or calf pain, call your surgeon's office.   Call MD / Call 911   Complete by: As directed    If you experience chest pain or shortness of breath, CALL 911 and be transported to the hospital emergency room.  If you develope a fever above 101 F, pus (white drainage) or increased drainage or redness at the wound, or calf pain, call your surgeon's office.   Constipation Prevention   Complete by: As directed    Drink plenty of fluids.  Prune juice may be helpful.  You may use a stool softener, such as Colace (over the counter) 100 mg twice a day.  Use MiraLax (over the counter) for constipation as needed.   Constipation Prevention   Complete by: As directed    Drink plenty of fluids.  Prune juice may be helpful.  You may use a stool softener, such as Colace (over the counter) 100 mg twice a day.  Use MiraLax (over the counter) for constipation as needed.   Diet - low sodium heart healthy   Complete by: As directed    Diet - low sodium heart healthy   Complete by: As directed    Increase activity slowly as tolerated   Complete by: As directed    Increase activity slowly as tolerated   Complete by: As directed       Allergies as of 02/18/2019      Reactions   Nabumetone Other (See Comments)   UNSPECIFIED REACTION       Medication List    TAKE these medications   acetaminophen  500 MG tablet Commonly known as: TYLENOL Take 1,000 mg by mouth 2 (two) times daily.   Alogliptin Benzoate 25 MG Tabs Take 12.5 mg by mouth daily.   atorvastatin 40 MG tablet Commonly known as: LIPITOR Take 40 mg by mouth at bedtime.   Eliquis 5 MG Tabs tablet Generic drug: apixaban Take 5 mg by mouth 2 (two) times daily.   gabapentin 300 MG capsule Commonly known as: NEURONTIN Take 300-600 mg by mouth See admin instructions. Take 1 capsule (300 mg) by mouth in the morning, 1 capsule (300 mg) by mouth at lunch, & take 2 capsules (600 mg) by mouth at night.   glipiZIDE 10 MG tablet Commonly known as: GLUCOTROL Take 10 mg by mouth 2 (two) times daily.   loratadine 10 MG tablet Commonly known as:  CLARITIN Take 10 mg by mouth daily.   metFORMIN 500 MG tablet Commonly known as: GLUCOPHAGE Take 500 mg by mouth 2 (two) times daily.   omega-3 acid ethyl esters 1 g capsule Commonly known as: LOVAZA Take 2 g by mouth 2 (two) times daily.   omeprazole 20 MG capsule Commonly known as: PRILOSEC Take 20 mg by mouth at bedtime.   Oxycodone HCl 10 MG Tabs Take 1 tablet (10 mg total) by mouth 2 (two) times daily as needed (pain.).   SOOTHE XP XTRA PROTECTION OP Place 1 drop into both eyes 3 (three) times daily as needed (dry/irritated eyes.).   tamsulosin 0.4 MG Caps capsule Commonly known as: FLOMAX Take 0.4 mg by mouth at bedtime.   tiZANidine 4 MG tablet Commonly known as: ZANAFLEX Take 2 mg by mouth at bedtime.   traZODone 150 MG tablet Commonly known as: DESYREL Take 150 mg by mouth at bedtime.   trospium 20 MG tablet Commonly known as: SANCTURA Take 20 mg by mouth at bedtime.   venlafaxine XR 150 MG 24 hr capsule Commonly known as: EFFEXOR-XR Take 150 mg by mouth 2 (two) times daily. Morning and noon   Vitamin D3 125 MCG (5000 UT) Tabs Take 5,000 Units by mouth daily.         Signed: Thea Gist 02/18/2019, 3:30 PM  [facility] is now [facility] [address], [place], [place] [address] Phone: [phone] Facebook  [employer]  [employer]

## 2019-02-18 NOTE — Progress Notes (Signed)
Occupational Therapy Treatment Patient Details Name: Devon Williams MRN: [id number] DOB: 10-21-1945 Today's Date: 02/18/2019    History of present illness 74 y/o male with PMH including Arthritis, Atrial fibrillation, CKD, Diabetes mellitus without complication, Dysrhythmia (2018). 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   OT comments  Improved participation and standing today. Pt didn't recall any of shoulder education. Performed allowed exercises.  Cath was off when OT arrived. Pt did vocalize that chair pad was wet.  Assisted with cleaning up  Follow Up Recommendations  SNF;Supervision/Assistance - 24 hour    Equipment Recommendations  None recommended by OT    Recommendations for Other Services      Precautions / Restrictions Precautions Precautions: Shoulder;Fall Type of Shoulder Precautions: Conservative protocal Shoulder Interventions: Shoulder sling/immobilizer;At all times;Off for dressing/bathing/exercises Precaution Booklet Issued: Yes (comment) Precaution Comments: may perform elbow wrist an Required Braces or Orthoses: Sling Restrictions RUE Weight Bearing: Non weight bearing       Mobility Bed Mobility                  Transfers   Equipment used: 1 person hand held assist   Sit to Stand: Min guard         General transfer comment: +2 safety during hygiene    Balance                                           ADL either performed or assessed with clinical judgement   ADL           Upper Body Bathing: Maximal assistance;Sitting  Lower Body bathing:  Max A, sit to stand     Upper Body Dressing : Maximal assistance           Toileting- Clothing Manipulation and Hygiene: Total assistance;+2 for safety/equipment         General ADL Comments: reviewed and performed UB bathing and dressing.  catheter was off when OT arrived and pad saturated     Vision       Perception     Praxis       Cognition Arousal/Alertness: Awake/alert Behavior During Therapy: WFL for tasks assessed/performed Overall Cognitive Status: Impaired/Different from baseline                       Memory: Decreased short-term memory Following Commands: Follows one step commands consistently Safety/Judgement: Decreased awareness of safety;Decreased awareness of deficits     General Comments: improved cognition from last notes        Exercises Exercises: Other exercises Other Exercises Other Exercises: performed 10 reps elbow, supination, wrist and hand.   Also 5 gentle lap slides   Shoulder Instructions       General Comments      Pertinent Vitals/ Pain       Faces Pain Scale: Hurts little more Pain Location: R shoulder  Pain Descriptors / Indicators: Grimacing Pain Intervention(s): Limited activity within patient's tolerance;Monitored during session;Repositioned  Home Living                                          Prior Functioning/Environment              Frequency  Min 2X/week  Progress Toward Goals  OT Goals(current goals can now be found in the care plan section)  Progress towards OT goals: Progressing toward goals(slowly)     Plan      Co-evaluation                 AM-PAC OT "6 Clicks" Daily Activity     Outcome Measure   Help from another person eating meals?: A Little Help from another person taking care of personal grooming?: A Little Help from another person toileting, which includes using toliet, bedpan, or urinal?: Total Help from another person bathing (including washing, rinsing, drying)?: A Lot Help from another person to put on and taking off regular upper body clothing?: A Lot Help from another person to put on and taking off regular lower body clothing?: Total 6 Click Score: 12    End of Session    OT Visit Diagnosis: Muscle weakness (generalized) (M62.81);Pain Pain - Right/Left: Right Pain - part of  body: Shoulder   Activity Tolerance Patient tolerated treatment well;Other (comment)   Patient Left in chair;with call bell/phone within reach;with chair alarm set;with family/visitor present   Nurse Communication          Time: [phone] OT Time Calculation (min): 25 min  Charges: OT General Charges $OT Visit: 1 Visit OT Treatments $Self Care/Home Management : 8-22 mins $Therapeutic Exercise: 8-22 mins  Shunna Mikaelian S, OTR/L Acute Rehabilitation Services 02/18/2019   Yailin Biederman 02/18/2019, 9:48 AM

## 2019-02-19 LAB — URINE CULTURE: Culture: 80000 — AB

## 2019-02-22 LAB — CULTURE, BLOOD (ROUTINE X 2)
Culture: NO GROWTH
Culture: NO GROWTH
Special Requests: ADEQUATE
Special Requests: ADEQUATE

## 2021-09-19 IMAGING — CT CT ANGIO CHEST
2 of 7 series · 18 of 46 positions shown · IV contrast (OMNIPAQUE)
Comparison: Single-view of the chest 02/17/2019.

CLINICAL DATA: Patient status post right shoulder arthroplasty 4
days ago. Continued fever. Elevated D-dimer.

EXAM:
CT ANGIOGRAPHY CHEST WITH CONTRAST
TECHNIQUE: Multidetector CT imaging of the chest was performed using the
standard protocol during bolus administration of intravenous
contrast. Multiplanar CT image reconstructions and MIPs were
obtained to evaluate the vascular anatomy.
CONTRAST:  100 mL OMNIPAQUE IOHEXOL 350 MG/ML SOLN

[Series 5: thins · axial · 0.77mm/px · z∈[+1296,+1594]mm · 16 of 336 slices shown]
[im 19/336  lung]
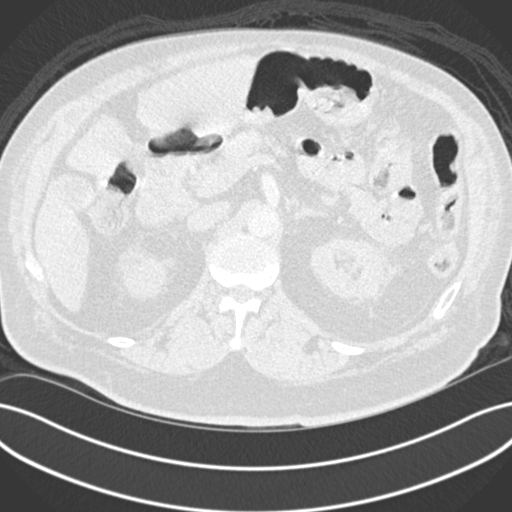
[im 38/336  soft-tissue]
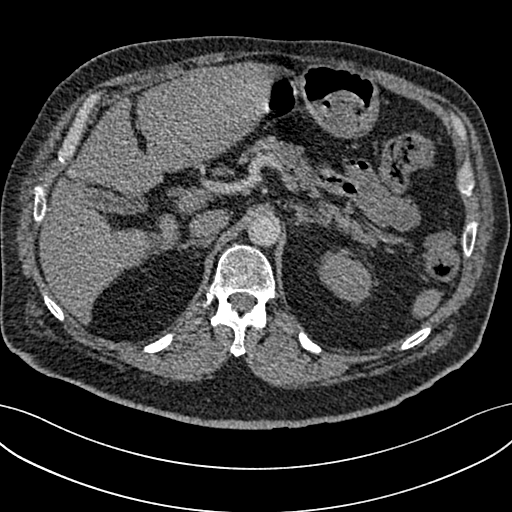
[im 56/336  lung]
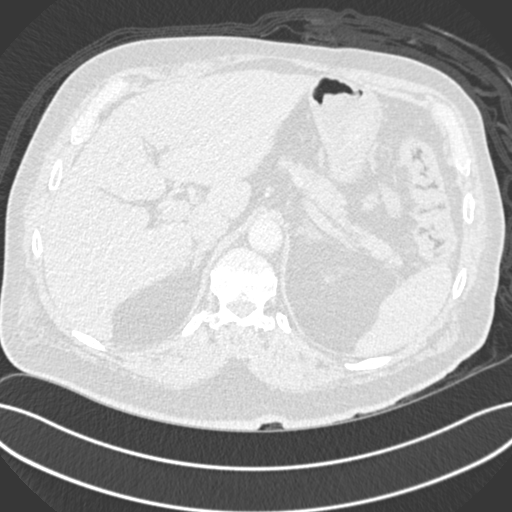
[im 75/336  soft-tissue]
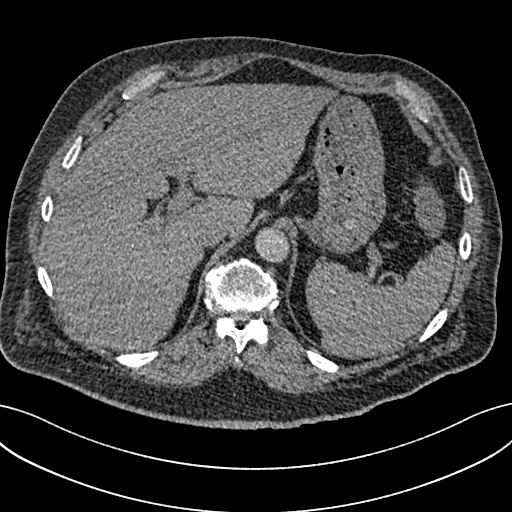
[im 94/336  lung]
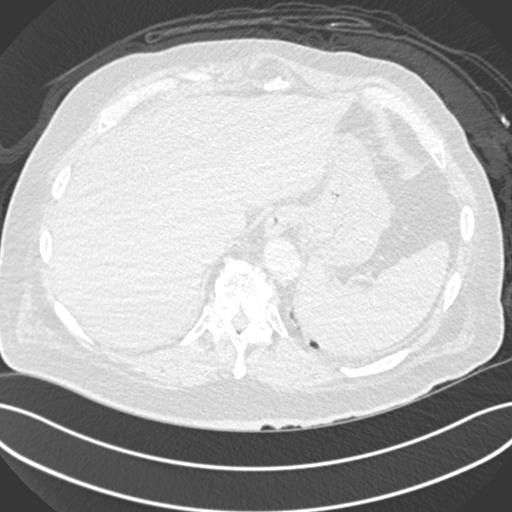
[im 112/336  soft-tissue]
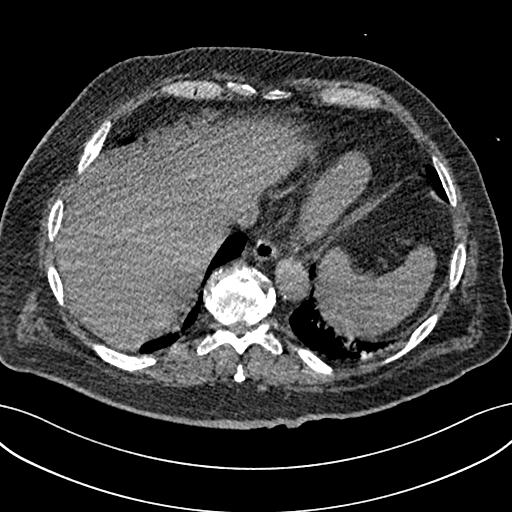
[im 131/336  lung]
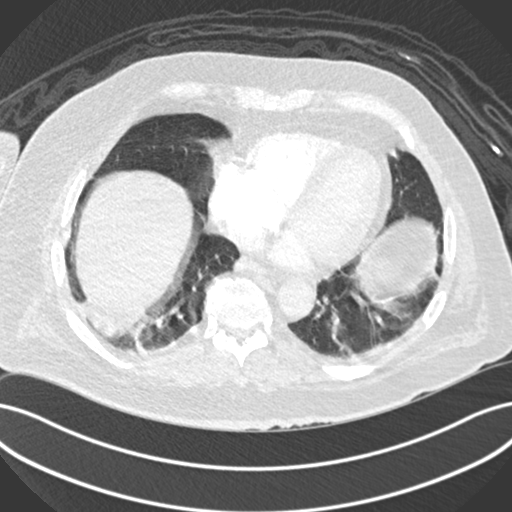
[im 149/336  soft-tissue]
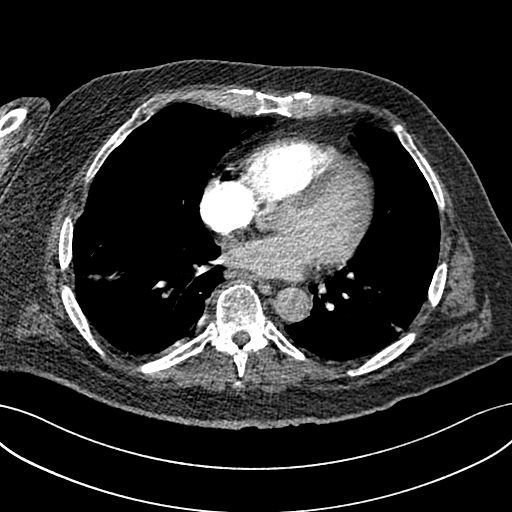
[im 187/336  lung]
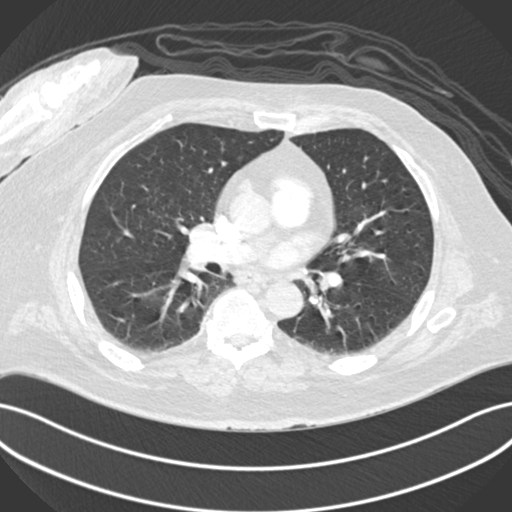
[im 205/336  soft-tissue]
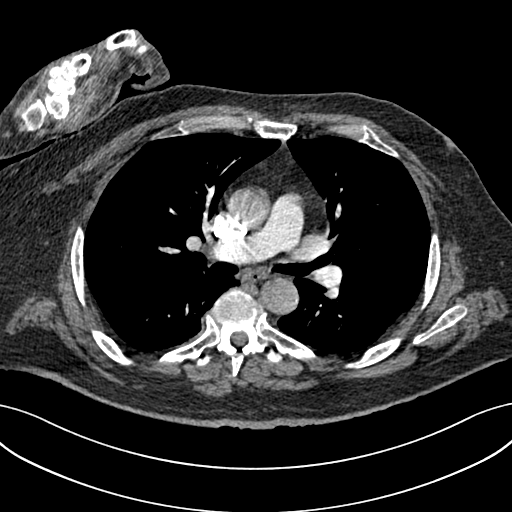
[im 224/336  lung]
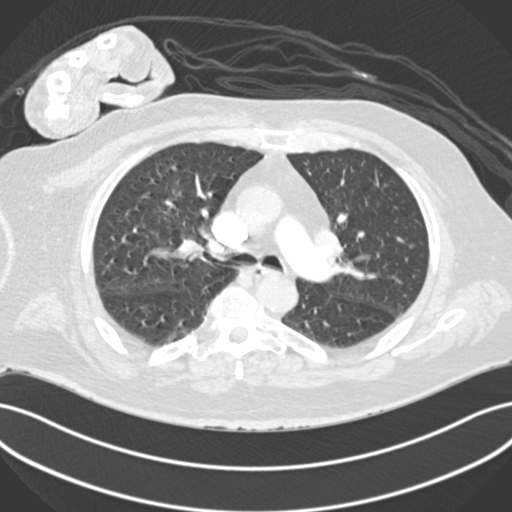
[im 242/336  soft-tissue]
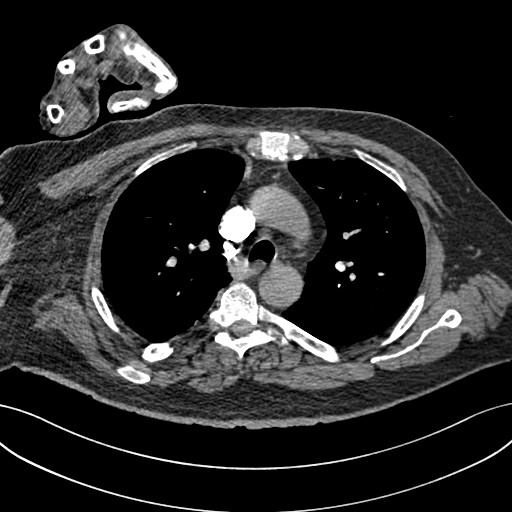
[im 261/336  lung]
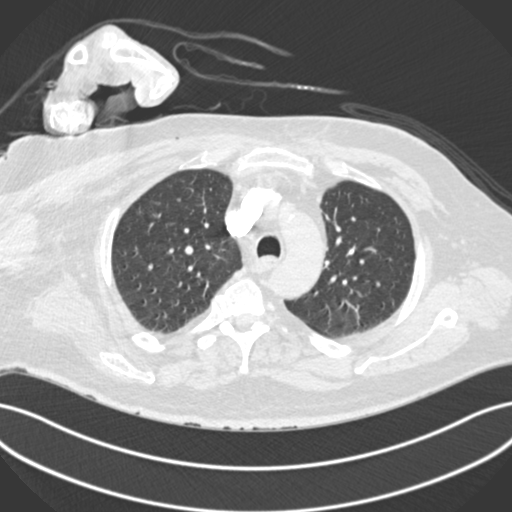
[im 280/336  soft-tissue]
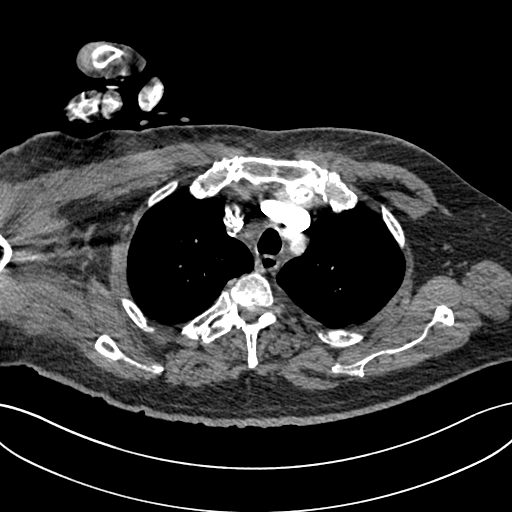
[im 298/336  lung]
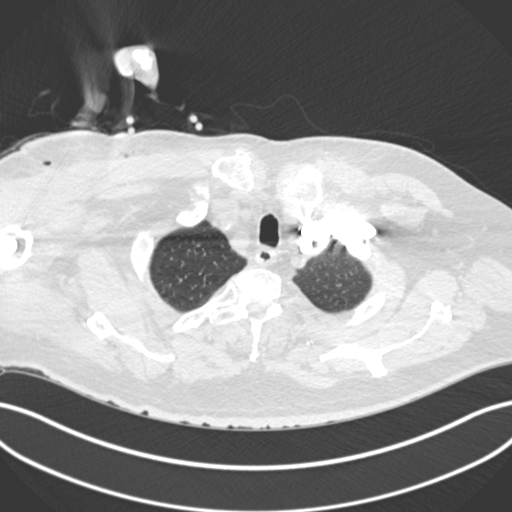
[im 317/336  soft-tissue]
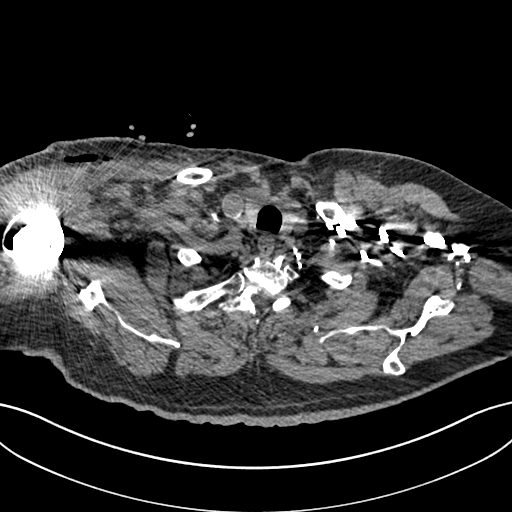

[Series 6: coronal mpr · coronal · 0.73mm/px · 2 of 91 slices shown]
[im 31/91  soft-tissue]
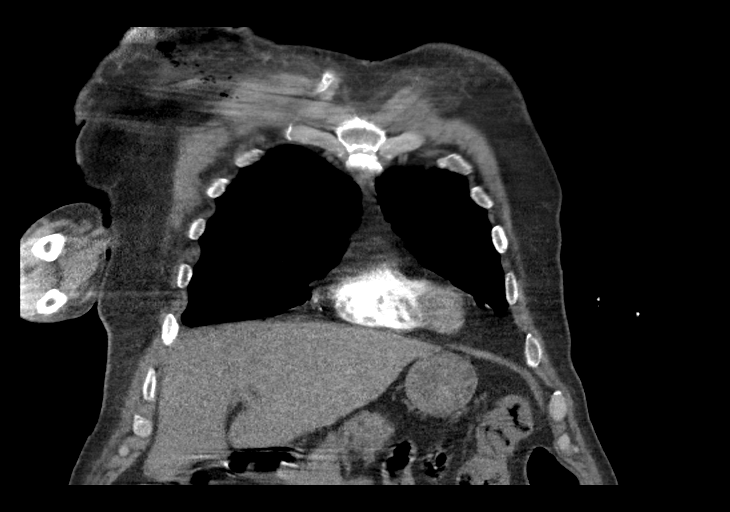
[im 61/91  soft-tissue]
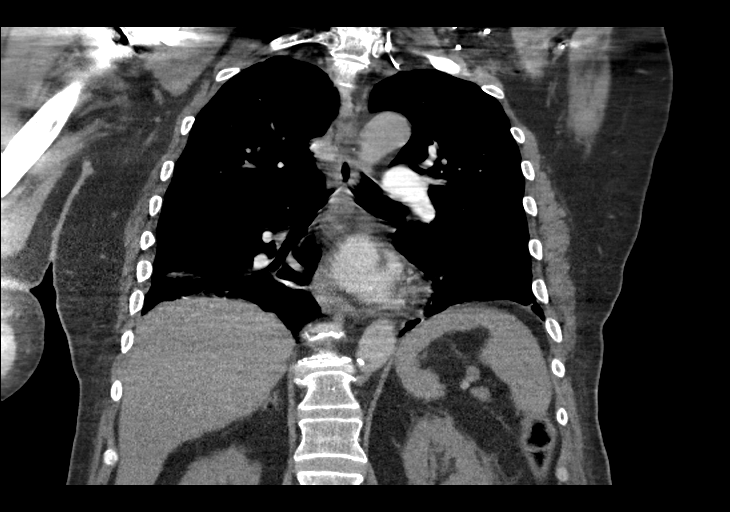

[18 of 46 positions shown; findings below may reference images not displayed]

FINDINGS: Cardiovascular: Satisfactory opacification of the pulmonary arteries
to the segmental level. No evidence of pulmonary embolism. Normal
heart size. No pericardial effusion. Calcific aortic and coronary
atherosclerosis noted.

Mediastinum/Nodes: No enlarged mediastinal, hilar, or axillary lymph
nodes. Thyroid gland, trachea, and esophagus demonstrate no
significant findings.

Lungs/Pleura: No pleural effusion. Mild linear atelectasis in the
lung bases is noted. The lungs are otherwise clear.

Upper Abdomen: Negative.

Musculoskeletal: No acute abnormality. Gas in the soft tissues from
the patient's right shoulder replacement is noted.

Review of the MIP images confirms the above findings.
IMPRESSION: Negative for pulmonary embolus or acute disease. No finding to
explain the patient's symptoms.

Aortic Atherosclerosis ([id number].T). Calcific coronary artery
disease also noted.
